# Patient Record
Sex: Female | Born: 1950 | Race: White | Hispanic: No | Smoking: Never smoker
Health system: Southern US, Community
[De-identification: ages and names within clinical notes are randomized; demographics above are authoritative.]

## PROBLEM LIST (undated history)

## (undated) DIAGNOSIS — M069 Rheumatoid arthritis, unspecified: Secondary | ICD-10-CM

## (undated) DIAGNOSIS — F329 Major depressive disorder, single episode, unspecified: Secondary | ICD-10-CM

## (undated) DIAGNOSIS — F419 Anxiety disorder, unspecified: Secondary | ICD-10-CM

## (undated) DIAGNOSIS — M797 Fibromyalgia: Secondary | ICD-10-CM

## (undated) DIAGNOSIS — F32A Depression, unspecified: Secondary | ICD-10-CM

## (undated) DIAGNOSIS — M359 Systemic involvement of connective tissue, unspecified: Secondary | ICD-10-CM

## (undated) DIAGNOSIS — J984 Other disorders of lung: Secondary | ICD-10-CM

## (undated) HISTORY — DX: Other disorders of lung: J98.4

## (undated) HISTORY — PX: ABDOMINAL HYSTERECTOMY: SHX81

## (undated) HISTORY — DX: Rheumatoid arthritis, unspecified: M06.9

## (undated) HISTORY — DX: Fibromyalgia: M79.7

## (undated) HISTORY — DX: Anxiety disorder, unspecified: F41.9

## (undated) HISTORY — DX: Major depressive disorder, single episode, unspecified: F32.9

## (undated) HISTORY — DX: Depression, unspecified: F32.A

---

## 1998-08-06 ENCOUNTER — Other Ambulatory Visit: Admission: RE | Admit: 1998-08-06 | Discharge: 1998-08-06 | Payer: Self-pay | Admitting: Obstetrics and Gynecology

## 1999-12-14 ENCOUNTER — Other Ambulatory Visit: Admission: RE | Admit: 1999-12-14 | Discharge: 1999-12-14 | Payer: Self-pay | Admitting: Obstetrics and Gynecology

## 2000-03-12 ENCOUNTER — Inpatient Hospital Stay (HOSPITAL_COMMUNITY): Admission: EM | Admit: 2000-03-12 | Discharge: 2000-03-17 | Payer: Self-pay | Admitting: *Deleted

## 2002-09-29 ENCOUNTER — Other Ambulatory Visit: Admission: RE | Admit: 2002-09-29 | Discharge: 2002-09-29 | Payer: Self-pay | Admitting: Obstetrics and Gynecology

## 2003-10-08 ENCOUNTER — Other Ambulatory Visit: Admission: RE | Admit: 2003-10-08 | Discharge: 2003-10-08 | Payer: Self-pay | Admitting: Obstetrics and Gynecology

## 2005-02-27 ENCOUNTER — Other Ambulatory Visit: Admission: RE | Admit: 2005-02-27 | Discharge: 2005-02-27 | Payer: Self-pay | Admitting: Obstetrics and Gynecology

## 2006-02-28 ENCOUNTER — Emergency Department (HOSPITAL_COMMUNITY): Admission: EM | Admit: 2006-02-28 | Discharge: 2006-03-01 | Payer: Self-pay | Admitting: Emergency Medicine

## 2006-04-02 ENCOUNTER — Encounter: Admission: RE | Admit: 2006-04-02 | Discharge: 2006-04-02 | Payer: Self-pay | Admitting: Neurosurgery

## 2006-04-19 ENCOUNTER — Encounter: Admission: RE | Admit: 2006-04-19 | Discharge: 2006-04-19 | Payer: Self-pay | Admitting: Endocrinology

## 2006-04-19 ENCOUNTER — Other Ambulatory Visit: Admission: RE | Admit: 2006-04-19 | Discharge: 2006-04-19 | Payer: Self-pay | Admitting: Interventional Radiology

## 2006-04-19 ENCOUNTER — Encounter (INDEPENDENT_AMBULATORY_CARE_PROVIDER_SITE_OTHER): Payer: Self-pay | Admitting: *Deleted

## 2006-12-07 ENCOUNTER — Encounter: Admission: RE | Admit: 2006-12-07 | Discharge: 2006-12-07 | Payer: Self-pay | Admitting: Endocrinology

## 2008-03-15 IMAGING — US US SOFT TISSUE HEAD/NECK
1 series · 14 of 25 positions shown · non-contrast
Comparison: none

CLINICAL DATA: Follow up thyroid nodules.  
 THYROID ULTRASOUND:
TECHNIQUE: Ultrasound examination of the thyroid gland and adjacent soft tissue structures was performed.

[Series 1: us soft tissue head/neck · 0.06mm/px · 14 of 40 slices shown]
[im 1/40]
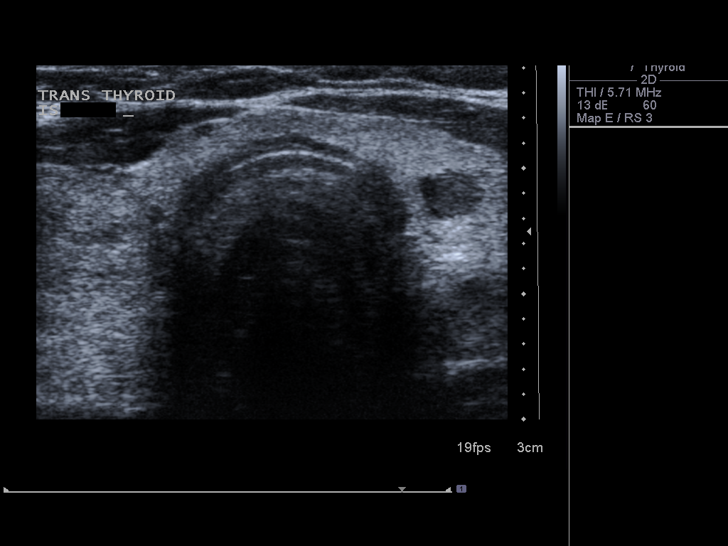
[im 4/40]
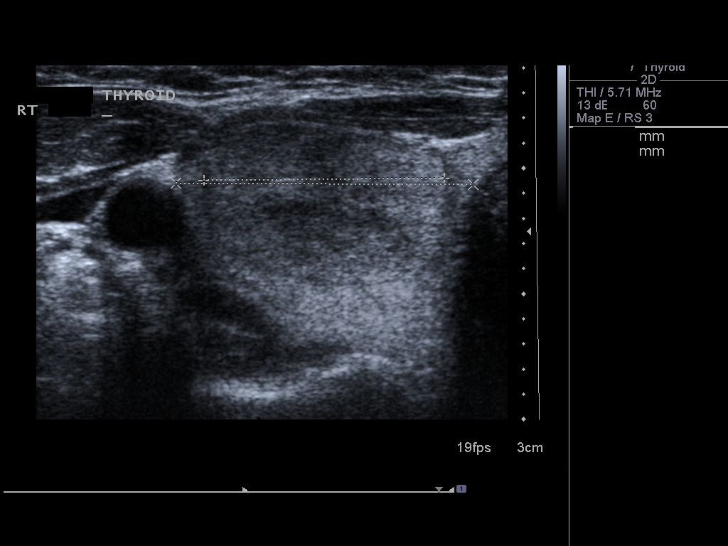
[im 7/40]
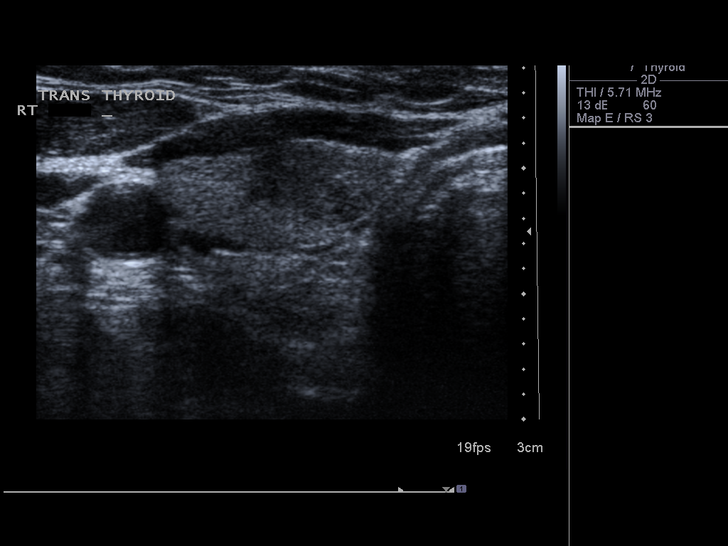
[im 10/40]
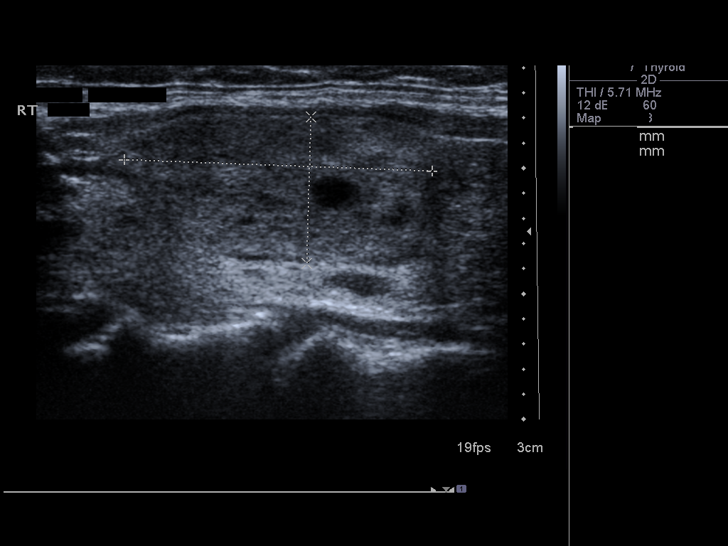
[im 14/40]
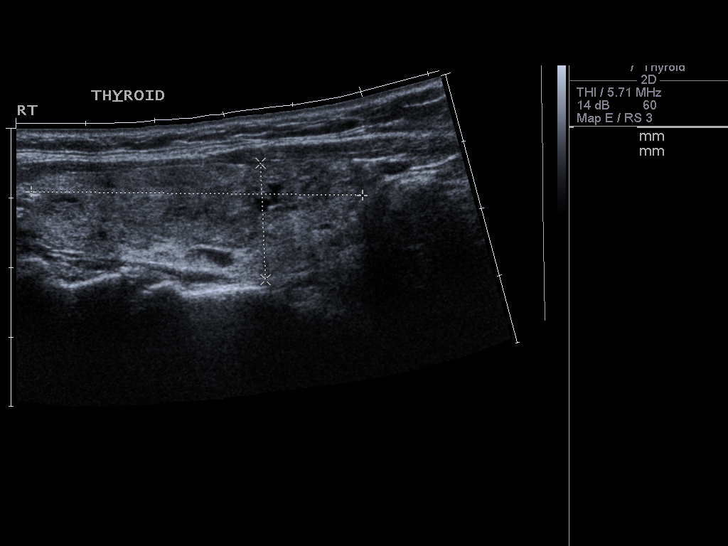
[im 15/40]
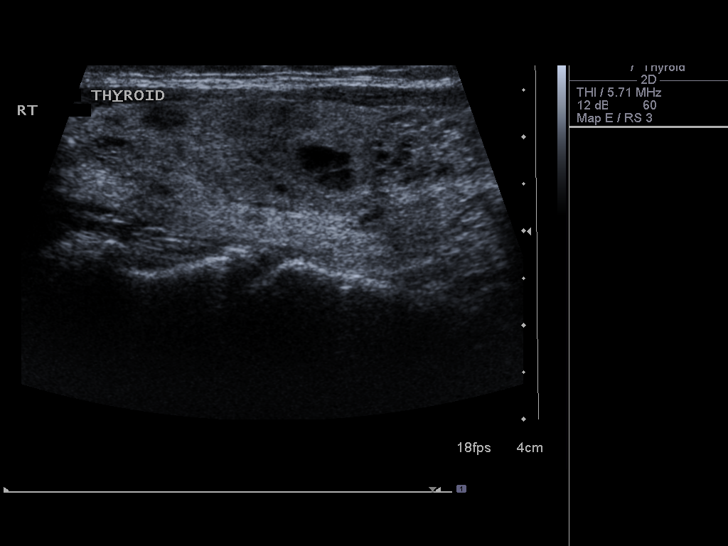
[im 18/40]
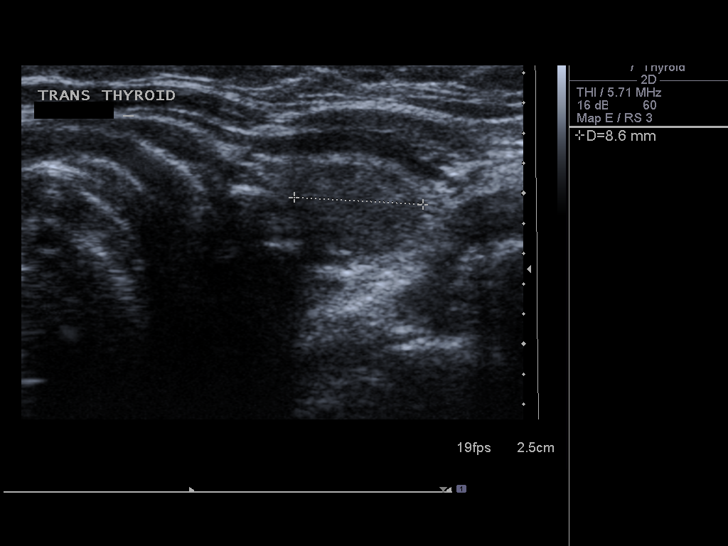
[im 22/40]
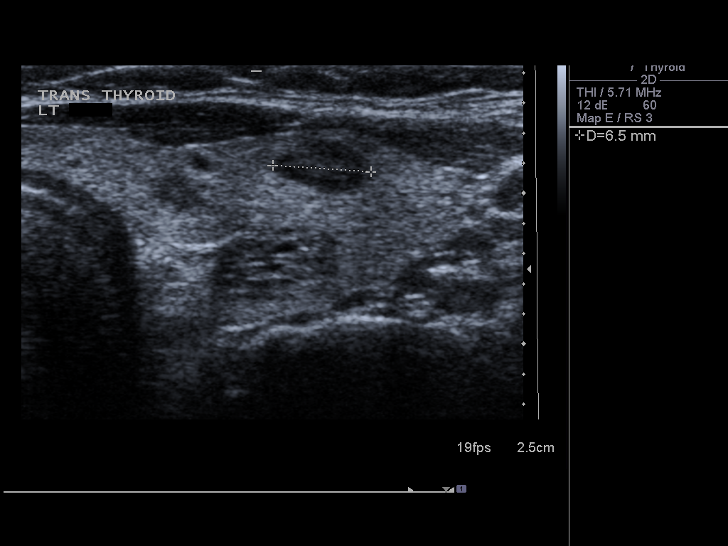
[im 25/40]
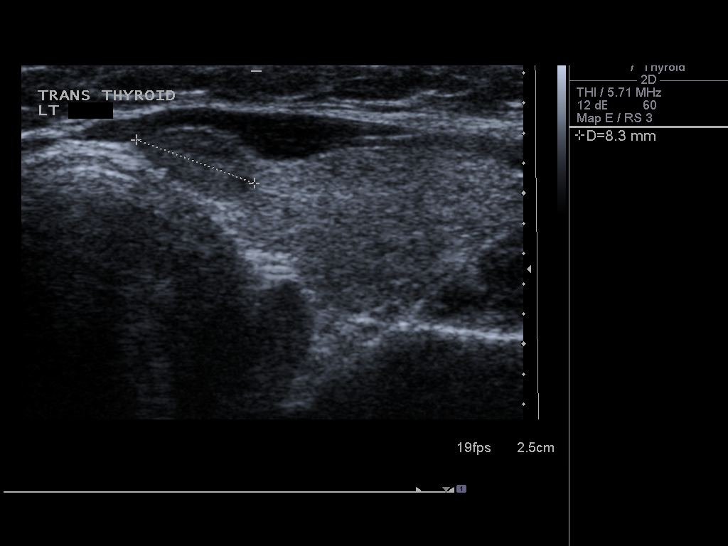
[im 27/40]
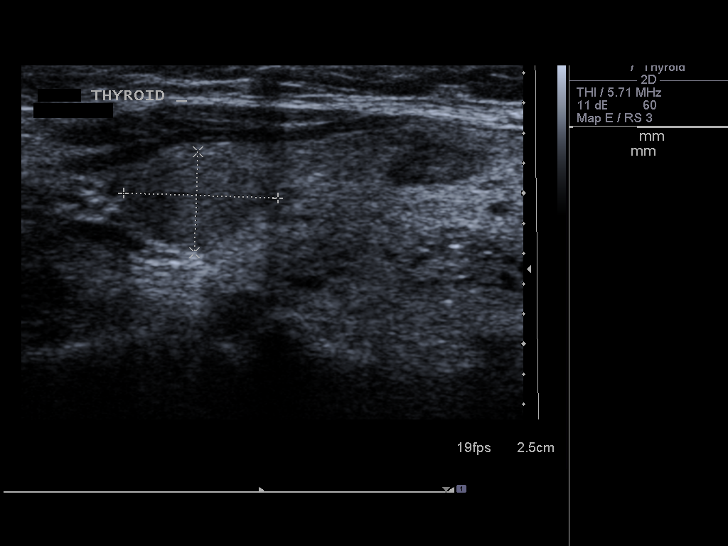
[im 30/40]
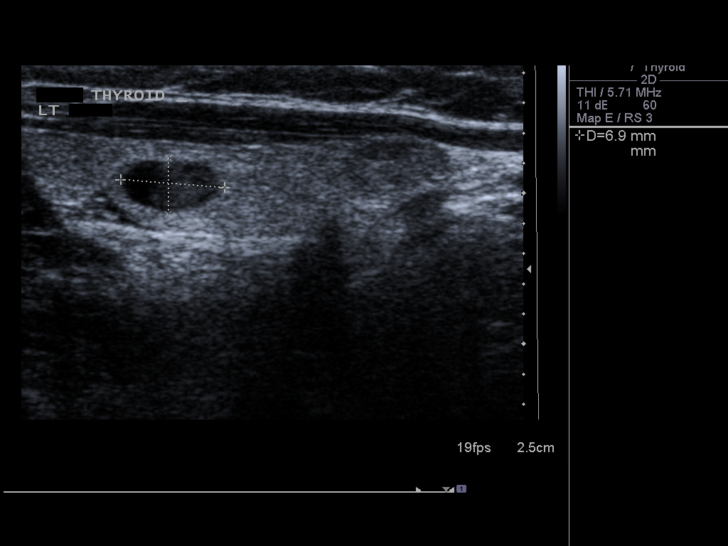
[im 33/40]
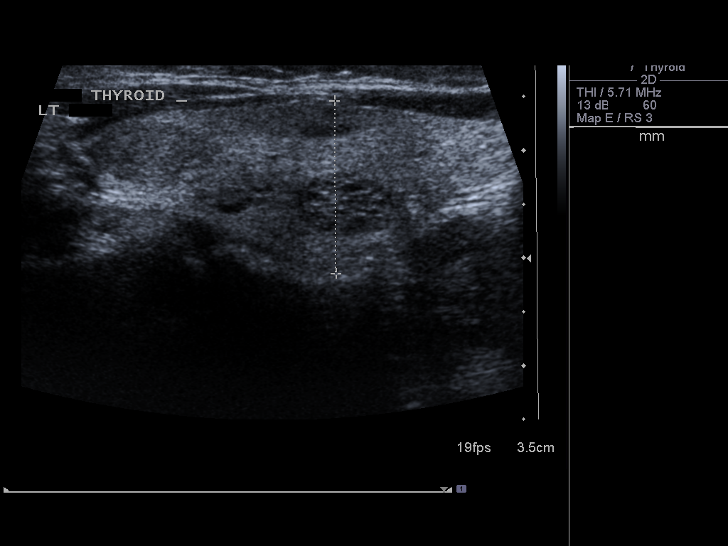
[im 36/40]
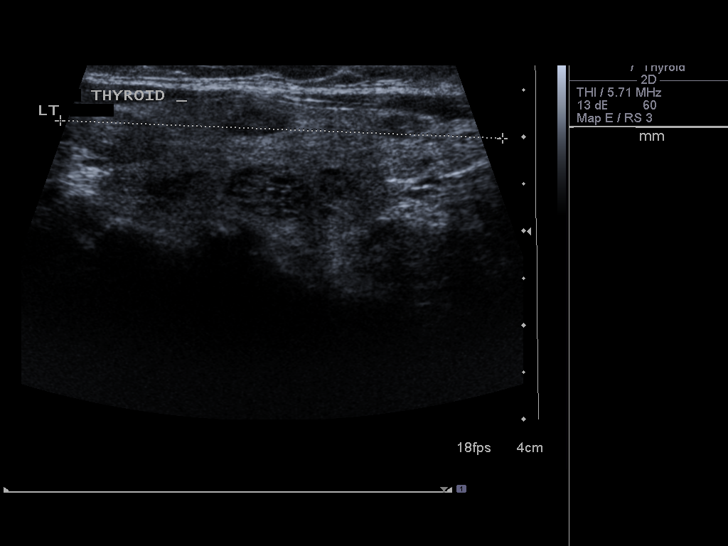
[im 40/40]
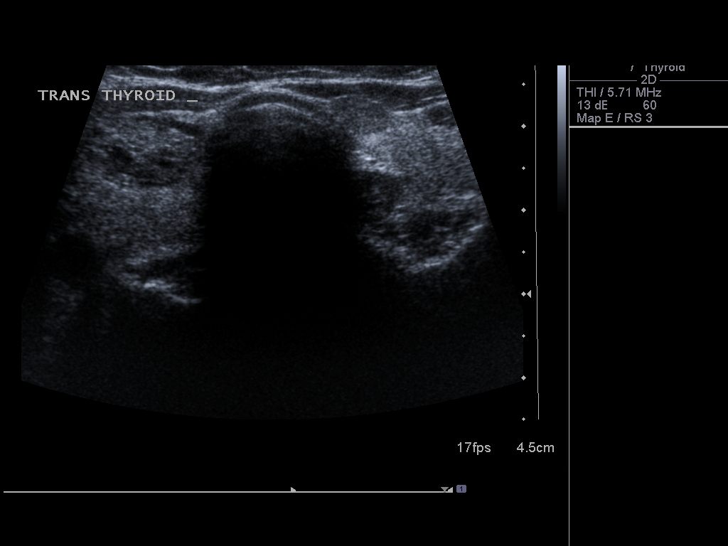

[14 of 25 positions shown; findings below may reference images not displayed]

FINDINGS: Currently thyroid gland is upper limits of normal in size with the right lobe measuring 4.8cm long x 1.8cm AP x 2.4cm wide (04/02/06 5.4 x 2.2 x 2.3cm).  Left lobe currently measures 4.7cm long x 1.6cm AP x 2.2cm wide (previous left lobe 4.7 x 1.5 x 2.1cm).  The isthmus measures 2mm.  Thyroid echotexture is diffusely inhomogeneous.  Again seen are multiple primarily sub-cm nodules.  The dominant mid right lobe solid nodule currently measures 2.5cm long x 1.2cm AP x 1.9cm wide and previously measured 2.3 x 1.8 x 1.5cm.  No new significant abnormality is seen.
IMPRESSION: 1.  Essentially stable 2.5cm solid dominant nodule mid right lobe. 
 2.  Thyroid gland upper limits of normal in size with multiple probable adenomatous changes consistent with slight multinodular goiter.

## 2009-04-06 ENCOUNTER — Inpatient Hospital Stay (HOSPITAL_COMMUNITY): Admission: EM | Admit: 2009-04-06 | Discharge: 2009-04-12 | Payer: Self-pay | Admitting: Emergency Medicine

## 2009-04-06 ENCOUNTER — Ambulatory Visit: Payer: Self-pay | Admitting: Family Medicine

## 2009-04-06 ENCOUNTER — Encounter: Payer: Self-pay | Admitting: Rheumatology

## 2009-04-06 ENCOUNTER — Ambulatory Visit: Payer: Self-pay | Admitting: Critical Care Medicine

## 2009-04-13 ENCOUNTER — Telehealth: Payer: Self-pay | Admitting: Family Medicine

## 2009-04-26 ENCOUNTER — Telehealth: Payer: Self-pay | Admitting: Family Medicine

## 2009-04-27 ENCOUNTER — Encounter: Payer: Self-pay | Admitting: *Deleted

## 2009-05-03 ENCOUNTER — Telehealth (INDEPENDENT_AMBULATORY_CARE_PROVIDER_SITE_OTHER): Payer: Self-pay | Admitting: *Deleted

## 2009-06-01 ENCOUNTER — Encounter: Payer: Self-pay | Admitting: Family Medicine

## 2009-06-01 DIAGNOSIS — E039 Hypothyroidism, unspecified: Secondary | ICD-10-CM | POA: Insufficient documentation

## 2009-06-01 DIAGNOSIS — G4700 Insomnia, unspecified: Secondary | ICD-10-CM

## 2009-06-01 DIAGNOSIS — F419 Anxiety disorder, unspecified: Secondary | ICD-10-CM

## 2009-06-01 DIAGNOSIS — G43909 Migraine, unspecified, not intractable, without status migrainosus: Secondary | ICD-10-CM | POA: Insufficient documentation

## 2009-06-01 DIAGNOSIS — IMO0001 Reserved for inherently not codable concepts without codable children: Secondary | ICD-10-CM | POA: Insufficient documentation

## 2009-06-01 DIAGNOSIS — R5382 Chronic fatigue, unspecified: Secondary | ICD-10-CM

## 2009-06-01 DIAGNOSIS — M069 Rheumatoid arthritis, unspecified: Secondary | ICD-10-CM

## 2009-06-01 DIAGNOSIS — F410 Panic disorder [episodic paroxysmal anxiety] without agoraphobia: Secondary | ICD-10-CM | POA: Insufficient documentation

## 2009-06-17 ENCOUNTER — Encounter: Payer: Self-pay | Admitting: Family Medicine

## 2009-06-17 ENCOUNTER — Ambulatory Visit: Payer: Self-pay | Admitting: Family Medicine

## 2009-06-17 DIAGNOSIS — J984 Other disorders of lung: Secondary | ICD-10-CM

## 2009-06-18 ENCOUNTER — Encounter: Payer: Self-pay | Admitting: Family Medicine

## 2009-06-18 ENCOUNTER — Telehealth: Payer: Self-pay | Admitting: Family Medicine

## 2009-08-12 ENCOUNTER — Encounter (HOSPITAL_COMMUNITY): Admission: RE | Admit: 2009-08-12 | Discharge: 2009-10-06 | Payer: Self-pay | Admitting: Internal Medicine

## 2009-08-17 ENCOUNTER — Telehealth: Payer: Self-pay | Admitting: Family Medicine

## 2009-08-20 ENCOUNTER — Telehealth: Payer: Self-pay | Admitting: Family Medicine

## 2009-09-21 ENCOUNTER — Ambulatory Visit: Payer: Self-pay | Admitting: Family Medicine

## 2009-09-21 ENCOUNTER — Encounter: Payer: Self-pay | Admitting: Family Medicine

## 2009-09-21 DIAGNOSIS — M669 Spontaneous rupture of unspecified tendon: Secondary | ICD-10-CM | POA: Insufficient documentation

## 2009-09-21 LAB — CONVERTED CEMR LAB
Albumin: 4.5 g/dL (ref 3.5–5.2)
BUN: 21 mg/dL (ref 6–23)
Chloride: 106 meq/L (ref 96–112)
Potassium: 4 meq/L (ref 3.5–5.3)
RBC: 3.7 M/uL — ABNORMAL LOW (ref 3.87–5.11)
RDW: 21.4 % — ABNORMAL HIGH (ref 11.5–15.5)
TSH: 0.332 microintl units/mL — ABNORMAL LOW (ref 0.350–4.500)
Total Bilirubin: 0.2 mg/dL — ABNORMAL LOW (ref 0.3–1.2)
Total Protein: 7 g/dL (ref 6.0–8.3)
WBC: 3 10*3/uL — ABNORMAL LOW (ref 4.0–10.5)

## 2009-09-24 ENCOUNTER — Telehealth: Payer: Self-pay | Admitting: *Deleted

## 2009-09-30 ENCOUNTER — Encounter: Payer: Self-pay | Admitting: Family Medicine

## 2009-10-29 ENCOUNTER — Telehealth: Payer: Self-pay | Admitting: Family Medicine

## 2009-11-02 ENCOUNTER — Telehealth: Payer: Self-pay | Admitting: Family Medicine

## 2009-11-03 ENCOUNTER — Ambulatory Visit: Payer: Self-pay | Admitting: Family Medicine

## 2009-11-03 DIAGNOSIS — M79609 Pain in unspecified limb: Secondary | ICD-10-CM

## 2009-11-05 ENCOUNTER — Telehealth: Payer: Self-pay | Admitting: Family Medicine

## 2009-11-12 ENCOUNTER — Ambulatory Visit: Payer: Self-pay | Admitting: Family Medicine

## 2009-11-15 ENCOUNTER — Encounter: Payer: Self-pay | Admitting: Family Medicine

## 2009-11-15 ENCOUNTER — Telehealth: Payer: Self-pay | Admitting: *Deleted

## 2009-11-19 ENCOUNTER — Encounter: Payer: Self-pay | Admitting: Family Medicine

## 2009-12-03 ENCOUNTER — Telehealth: Payer: Self-pay | Admitting: Family Medicine

## 2009-12-13 ENCOUNTER — Encounter: Payer: Self-pay | Admitting: Family Medicine

## 2009-12-14 ENCOUNTER — Encounter: Payer: Self-pay | Admitting: Family Medicine

## 2009-12-14 ENCOUNTER — Ambulatory Visit: Payer: Self-pay | Admitting: Family Medicine

## 2009-12-14 LAB — CONVERTED CEMR LAB
ALT: 9 units/L (ref 0–35)
Basophils Relative: 1 % (ref 0–1)
CO2: 23 meq/L (ref 19–32)
Chloride: 103 meq/L (ref 96–112)
Eosinophils Absolute: 0.2 10*3/uL (ref 0.0–0.7)
Free T4: 0.76 ng/dL — ABNORMAL LOW (ref 0.80–1.80)
HCT: 36.7 % (ref 36.0–46.0)
Hemoglobin: 12.6 g/dL (ref 12.0–15.0)
Lymphocytes Relative: 23 % (ref 12–46)
Lymphs Abs: 0.9 10*3/uL (ref 0.7–4.0)
MCV: 103.1 fL — ABNORMAL HIGH (ref 78.0–100.0)
Monocytes Absolute: 0.3 10*3/uL (ref 0.1–1.0)
Monocytes Relative: 9 % (ref 3–12)
Neutrophils Relative %: 62 % (ref 43–77)
Potassium: 4.3 meq/L (ref 3.5–5.3)
RBC: 3.56 M/uL — ABNORMAL LOW (ref 3.87–5.11)
Sodium: 139 meq/L (ref 135–145)
Total Bilirubin: 0.2 mg/dL — ABNORMAL LOW (ref 0.3–1.2)
Total Protein: 7.4 g/dL (ref 6.0–8.3)
WBC: 4 10*3/uL (ref 4.0–10.5)

## 2009-12-28 ENCOUNTER — Telehealth: Payer: Self-pay | Admitting: Family Medicine

## 2009-12-28 ENCOUNTER — Telehealth: Payer: Self-pay | Admitting: *Deleted

## 2010-02-17 ENCOUNTER — Ambulatory Visit: Admit: 2010-02-17 | Payer: Self-pay

## 2010-02-20 ENCOUNTER — Encounter: Payer: Self-pay | Admitting: Internal Medicine

## 2010-02-20 ENCOUNTER — Encounter: Payer: Self-pay | Admitting: Endocrinology

## 2010-02-23 ENCOUNTER — Ambulatory Visit: Admit: 2010-02-23 | Payer: Self-pay

## 2010-03-01 ENCOUNTER — Encounter: Payer: Self-pay | Admitting: Family Medicine

## 2010-03-03 NOTE — Progress Notes (Signed)
Summary: records request  Phone Note Call from Patient   Caller: Patient Call For: [phone] Summary of Call: Pt want latest lab report to also be sent to Dr. Sharl Ma with [facility].   Pt didn't have phone or fax number to leave. Initial call taken by: Abundio Miu,  November 05, 2009 3:33 PM    Do we need her to sign a release of information first? Alvia Grove  Appended Document: records request She already signed a ROI.  It is in the to be scanned pile

## 2010-03-03 NOTE — Miscellaneous (Signed)
Summary: needs appt  Clinical Lists Changes unable to reach by phone. never came to hosp f/u & we are getting refill requests. the only number I have has a full mailbox. I faxed the request back to the pharmacy asking if they had another number. pt will need an appt.Golden Circle RN  Jun 01, 2009 12:14 PM      Agree.  She has scheduled and cancelled 2 appts.  No med refills.  Alvia Grove

## 2010-03-03 NOTE — Progress Notes (Signed)
Summary: Rx Prob  Phone Note Call from Patient Call back at Home Phone [phone]   Caller: Patient Summary of Call: Pt says pharmacy does not have the rx that was sent in on 05/01/09.   Initial call taken by: Clydell Hakim,  May 03, 2009 2:11 PM  Follow-up for Phone Call        spoke with patient and she states pharmacy now has Rx. Follow-up by: Theresia Lo RN,  May 03, 2009 3:54 PM

## 2010-03-03 NOTE — Progress Notes (Signed)
Summary: phn msg  Phone Note From Other Clinic   Caller: [facility] Summary of Call: [facility] has discharged her from their practice - they will not be able to see her. Initial call taken by: De Nurse,  December 28, 2009 8:35 AM  Follow-up for Phone Call        Will attempt to refer to [facility] called and left message for Joann-new patient coordinator. Will also fax pt records there, FYI to MD Follow-up by: Garen Grams LPN,  December 30, 2009 3:50 PM

## 2010-03-03 NOTE — Progress Notes (Signed)
Summary: Rx Req  Phone Note Call from Patient Call back at Home Phone [phone]   Caller: Patient Summary of Call: Pt wondering if she can get refills on her meds.  She rescheduled her appt for August 9th at 1:30.   Initial call taken by: Clydell Hakim,  August 20, 2009 2:46 PM     No, she has to be seen first.

## 2010-03-03 NOTE — Progress Notes (Signed)
Summary: refill  Phone Note Refill Request Call back at Home Phone [phone] Message from:  Patient  Refills Requested: Medication #1:  PERCOCET 5-325 MG TABS 1 tab by mouth every 4 hours as needed for pain Initial call taken by: De Nurse,  December 03, 2009 2:07 PM    Prescriptions: PERCOCET 5-325 MG TABS (OXYCODONE-ACETAMINOPHEN) 1 tab by mouth every 4 hours as needed for pain  #60 x 0   Entered and Authorized by:   Alvia Grove DO   Signed by:   Alvia Grove DO on 12/03/2009   Method used:   Print then Give to Patient   RxID:   [id number] PERCOCET 5-325 MG TABS (OXYCODONE-ACETAMINOPHEN) 1 tab by mouth every 4 hours as needed for pain  #60 x 0   Entered and Authorized by:   Alvia Grove DO   Signed by:   Alvia Grove DO on 12/03/2009   Method used:   Print then Give to Patient   RxID:   [id number]  NOTE ONLY 1 SCRIPT GIVEN.  2nd printed in error.

## 2010-03-03 NOTE — Progress Notes (Signed)
Summary: Rx Prob  Phone Note Call from Patient Call back at Home Phone [phone]   Caller: Patient Summary of Call: Pt called and to say that the only medicine that insurance would not cover was azithromycin.  Can something else be called in in its place.  Her pharmacy is [facility] [place], [place]  [id number]. Initial call taken by: Clydell Hakim,  April 13, 2009 11:23 AM      Pt was not on antibiotics in hospital and I just reviewed all of her discharge meds and she was not given azithromycin.    Appended Document: Rx Prob called pharmacy and azythromycin was given to patient by Benn Moulder from [facility]. this was not a med given at discharge from hospital. she saw  doctor at [facility] Tuesday AM then went into hospital Tuesday evening. she took all RX she had to pharmacy. advised patient to contact [facility] about this.

## 2010-03-03 NOTE — Consult Note (Signed)
[facility]: [facility]   Imported By: Knox Royalty 12/31/2009 11:38:38  _____________________________________________________________________  External Attachment:    Type:   Image     Comment:   External Document

## 2010-03-03 NOTE — Assessment & Plan Note (Signed)
Summary: Carla Wilson   Vital Signs:  Patient profile:   60 year old female Height:      64 inches Weight:      164 pounds BMI:     28.25 BSA:     1.80 Temp:     98.6 degrees F Pulse rate:   99 / minute BP sitting:   140 / 100  Vitals Entered By: Jone Baseman CMA (Jun 17, 2009 1:37 PM) CC: NP - HFU Is Patient Diabetic? No Pain Assessment Patient in pain? yes     Location: back and feet Intensity: 8   Primary Care Provider:  Alvia Grove DO  CC:  NP - HFU.  History of Present Illness: Pt here for hospital f/u (suppose to be seen in 2022-04-19, but had death in family): 1. RA: Saw Dr. Corliss Skains, [facility], yesterday.  Pt reports Dr. Corliss Skains wanting pt to continue cymbalta and f/u with pulmonology for ? lung dz.  Pt to see [place] pulm tomorrow. Per pt report Dr. Corliss Skains considering leflunomide.  Labs drawn at [facility] yesterday.  Pt reports + arthritic pain in AM lasting > than 1 hour, swollen joints on bilateral hands, pain in most joints thurout body.  2.  Lung dz: Pt continues taking prednisone 20mg  once daily per pulm.  To see pulm tomorrow.  Reports weight gain with medicine.  No SOB, no dyspnea, no difficulty breathing or cathcing her breath.   3.  Endocrine: Thyroid nodule noted in hosptial.  Pt seen at [facility] endocr[facility], will get records from their office.  labs drawn at last visit. No meds started be endocrin.  Pt reports weight gain.  No hair loss, has chronic fatigue, no dry skin.    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 Take 1 Pill Daily 2)  Prednisone 20 Mg Tabs (Prednisone) .... Take 1 Pill Per Day As Dosed Per Your Pulmonolgoist 3)  Ultram 50 Mg Tabs (Tramadol Hcl) .Marland Kitchen.. 1 Pill By Mouth Q6 Hours As Needed Pain 4)  Ambien 10 Mg Tabs (Zolpidem Tartrate) .... Take 1 Pill At Bedtime Prn Insomnia 5)  Xanax 1 Mg Tabs (Alprazolam) .... Take 1 Pill By Mouth Two Times A Day Prn 6)  Vicodin 5-500 Mg Tabs (Hydrocodone-Acetaminophen) .... Take 1/2- 1 Pill Every 6-8 Hours As Needed Pain  Allergies (verified): 1)  ! Pcn 2)  ! Asa 3)  ! Demerol  Family History: RA - father cancer - mother and sibling heart attack - MGF stroke - PGM cerebral hemorrhage - PGF Leukemia- father  Social History: Father recently died of leukemia Married, lives with husband Never Smoked Alcohol use-no Drug use-no Smoking Status:  never Drug Use:  no  Review of Systems       The patient complains of weight gain.  The patient denies dyspnea on exertion, peripheral edema, headaches, abdominal pain, muscle weakness, transient blindness, and difficulty walking.    Physical Exam  General:  VS reviewed, BP elevated, alert, well-developed, well-nourished, and  well-hydrated.   Head:  normocephalic.   Eyes:  vision grossly intact.   Lungs:  normal respiratory effort, normal breath sounds, no crackles, and no wheezes.   Heart:  normal rate, regular rhythm, no murmur, no gallop, and no rub.   Abdomen:  soft, non-tender, normal bowel sounds, and no distention.   Msk:  decreased ROM, joint tenderness, joint swelling, enlarged MCP joints, and enlarged PIP joints.  No RA nodules noted.  Neurologic:  alert & oriented X3 and cranial nerves II-XII intact.   Skin:  no rashes.   Psych:  Oriented X3, good eye contact, not anxious appearing, and not depressed appearing.     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  Only saw Dr. Corliss Skains one time and that was yesterday.  Will get rocords from her office to see tx and labs drawn at visit yesterday.  Will refill cymbalta, ultram, ambien and vicodin.  Pain/med contract signed today.   See pt instructions Her updated medication list for this problem includes:    Prednisone 20 Mg Tabs (Prednisone) .Marland Kitchen... Take 1 pill per day as dosed per your pulmonolgoist  Orders: [facility]  Level 4 ([address])  Problem # 2:  PULMONARY DISEASE (ICD-518.89) Assessment: Unchanged F/u with pulm tomorrow.  Pt still on prednisone given to her by pulm.  Asked pt to have records of visit sent to me.  PE reassuring.  Pt denies SOB, dyspnea.  Await pulms reccomendations  Problem # 3:  HYPOTHYROIDISM (ICD-244.9) Assessment: Unchanged Pt denies any current s/s of hypothyroidsm.  Seen by endocrine recently.  Will request labs and records from that visit.    Complete Medication List: 1)  Cymbalta 60 Mg Cpep (Duloxetine hcl) .... Take 1 pill daily 2)  Prednisone 20 Mg Tabs (Prednisone) .... Take 1 pill per day as dosed per your pulmonolgoist 3)  Ultram 50 Mg Tabs (Tramadol hcl) .Marland Kitchen.. 1 pill by mouth q6 hours as needed pain 4)  Ambien 10 Mg Tabs (Zolpidem tartrate) .... Take 1 pill at bedtime prn insomnia 5)  Xanax 1 Mg Tabs (Alprazolam) .... Take 1 pill by mouth two times a day prn 6)  Vicodin 5-500 Mg Tabs (Hydrocodone-acetaminophen) .... Take 1/2- 1 pill every 6-8 hours as needed pain  Patient Instructions: 1)  Please sign a form for records from Dr. Corliss Skains and Dr. Stann Mainland, and Dr. Sharl Ma, endocrinologist [facility] ) and Dr. Vincente Poli (OB/Gyn).   2)  Schedule an appointment with me 5/31 at 330 (ok to double book) 3)  Start checking your blood pressures daily and call me in 1 week with the numbers. Prescriptions: VICODIN 5-500 MG TABS (HYDROCODONE-ACETAMINOPHEN) take 1/2- 1 pill every 6-8 hours as needed  pain  #60 x 0   Entered and Authorized by:   Alvia Grove DO   Signed by:   Alvia Grove DO on 06/21/2009   Method used:   Print then Give to Patient   RxID:   [id number] XANAX 1 MG TABS (ALPRAZOLAM) take 1 pill by mouth two times a day PRN  #60 x 3   Entered and Authorized by:   Alvia Grove DO   Signed by:   Alvia Grove DO on 06/21/2009   Method used:   Print then Give to Patient   RxID:   [id number] AMBIEN 10 MG TABS (ZOLPIDEM TARTRATE) take 1 pill at bedtime PRN insomnia  #30 x 5   Entered and Authorized  by:   Alvia Grove DO   Signed by:   Alvia Grove DO on 06/21/2009   Method used:   Print then Give to Patient   RxID:   [id number] ULTRAM 50 MG TABS (TRAMADOL HCL) 1 pill by mouth Q6 hours as needed pain  #90 x 0   Entered and Authorized by:   Alvia Grove DO   Signed by:   Alvia Grove DO on 06/21/2009   Method used:   Print then Give to Patient   RxID:   [id number] PREDNISONE 20 MG TABS (PREDNISONE) Take 1 pill per day as dosed per your Pulmonolgoist  #30 x 0   Entered and Authorized by:   Alvia Grove DO   Signed by:   Alvia Grove DO on 06/21/2009   Method used:   Print then Give to Patient   RxID:   [id number] CYMBALTA 60 MG CPEP (DULOXETINE HCL) Take 1 pill daily  #30 x 5   Entered and Authorized by:   Alvia Grove DO   Signed by:   Alvia Grove DO on 06/21/2009   Method used:   Print then Give to Patient   RxID:   [id number]

## 2010-03-03 NOTE — Progress Notes (Signed)
Summary: Rx Req  Phone Note Call from Patient Call back at [facility] Phone [phone]   Caller: Patient Summary of Call: Pt requesting rx for Vicoden and Ambien. Initial call taken by: Clydell Hakim,  August 17, 2009 1:48 PM    She has refills on the Dasher.  As for the vicodin, she has an appt scheduled tomorrow with me and I will discuss refills of vicodin with her then.

## 2010-03-03 NOTE — Consult Note (Signed)
[facility]: [facility]   Imported By: De Nurse 11/25/2009 14:07:19  _____________________________________________________________________  External Attachment:    Type:   Image     Comment:   External Document

## 2010-03-03 NOTE — Progress Notes (Signed)
Summary: phn msg  Phone Note Call from Patient Call back at [facility] Phone [phone]   Summary of Call: pt called say that the pharm will be calling in meds for her since she couldn't come to last appt. b/c her brother died.  next appt 06-10-2022 Initial call taken by: De Nurse,  April 26, 2009 11:09 AM     Pt should have followed up with rheum last week.  Unsure what plan is as I have not recieved records from them.  Please forward med requests to me and I will fill them based on medical neccesity; I have not seen pt since d/c on March 16th.

## 2010-03-03 NOTE — Assessment & Plan Note (Signed)
Summary: f/up,tcb   Vital Signs:  Patient profile:   60 year old female Height:      64 inches Weight:      176.25 pounds BMI:     30.36 BSA:     1.86 Temp:     98.6 degrees F Pulse rate:   101 / minute BP sitting:   137 / 90  Vitals Entered By: Jone Baseman CMA (November 12, 2009 2:29 PM) CC: right leg pain no better Is Patient Diabetic? No Pain Assessment Patient in pain? yes     Location: right leg Intensity: 10   Primary Care Rhyan Radler:  Alvia Grove DO  CC:  right leg pain no better.  History of Present Illness: 60 yo female here for f/u of right leg pain, RA, and thyroid problems: 1. Right leg pain: present for about 2 weeks. Saw Dr. Lelon Perla on the 5th of October for the same and was given Percocet and Neurontin for pain control.  Pt reports improvement of pain with those 2 meds, but pain is still present.  Unknown trigger.  No trauma, no known injury.  Pain starts in her right knee and moves up her leg and into her lower right back; also has pain to light touch in her right thigh. Describes pain as electric shock with some numbness.   No muscle weakness, no gait disturbances. denies loss of bowel / bladder function, skin redness, muscle spasm or cramps. 2. RA: Last saw Dr. Corliss Skains in June 2011.  Continues to take Cymbalta, flexeril, and ultram per Dr. Corliss Skains.  No acute swelling at joints, no joint tenderness or redness.  Does have morning stiffness that lasts longer than 1 hour.  Is not on a daily DMARD.  RA is manged by rheum. 3. Thyroid nodule: Recently seen by endocrine.  Pt is not sure of plan or when next appt with Dr. Sharl Ma is.  Denies heat/cold intolerance.  +weight gain, but was also on prednisone for multiple months.      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 Take 1  Pill By Mouth Daily As Needed For Pain 2)  Percocet 5-325 Mg Tabs (Oxycodone-Acetaminophen) .Marland Kitchen.. 1 Tab By Mouth Every 4 Hours As Needed For Pain 3)  Neurontin 100 Mg Caps (Gabapentin) .Marland Kitchen.. 1 Tab By Mouth Twice A Day For 3 Days Then Three Times Per Day 4)  Cymbalta 20 Mg Cpep (Duloxetine Hcl) .Marland Kitchen.. 1 Tab By Mouth Daily 5)  Lunesta 3 Mg Tabs (Eszopiclone) .... Take 1 Pill By Mouth Qhs 6)  Protonix 40 Mg Tbec (Pantoprazole Sodium) .Marland Kitchen.. 1 Pill By Mouth Two Times A Day 7)  Ultram 50 Mg Tabs (Tramadol Hcl) .Marland Kitchen.. 1 Pill By Mouth Q6 Hours As Needed Pain 8)  Flexeril 10 Mg Tabs (Cyclobenzaprine Hcl) .Marland Kitchen.. 1 Pill By Mouth At Bedtime 9)  Alprazolam 1 Mg Tabs (Alprazolam) .Marland Kitchen.. 1 Pill By Mouth Two Times A Day  Allergies (verified): 1)  ! Pcn 2)  ! Asa 3)  ! Demerol  Past History:  Past Medical History: Last updated: 06/01/2009 CHRONIC FATIGUE SYNDROME (ICD-780.71) FIBROMYALGIA (ICD-729.1) PANIC ATTACK (ICD-300.01) HYPOTHYROIDISM (ICD-244.9) MIGRAINE HEADACHE (ICD-346.90) ARTHRITIS, RHEUMATOID (ICD-714.0) ASTHMA (ICD-493.90) ANXIETY (ICD-300.00) * DEPRESSION INSOMNIA (ICD-780.52)  Past Surgical History: Last updated: 06/01/2009 hysterectomy C section  Family History: Last updated: 2009/06/26 RA - father cancer - mother and sibling heart attack - MGF stroke - PGM cerebral hemorrhage - PGF Leukemia- father  Social History: Last updated: 26-Jun-2009 Father recently died of leukemia Married, lives with husband Never Smoked Alcohol use-no Drug use-no  Risk Factors: Alcohol Use: 0  (11/12/2009)  Risk Factors: Smoking Status: never (11/12/2009)  Family History: Reviewed history from 2009-06-26 and no changes required. RA - father cancer - mother and sibling heart attack - MGF stroke - PGM cerebral hemorrhage - PGF Leukemia- father  Social History: Reviewed history from 06-26-09 and no changes required. Father recently died of leukemia Married, lives with husband Never Smoked Alcohol use-no Drug use-no  Review of Systems       see HPI for full ROS  Physical Exam  General:  VS reviewed, alert, well-developed, well-nourished, and well-hydrated.   Neck:  no thyroid nodules or tenderness and no JVD.   Lungs:  normal respiratory effort, normal breath sounds, no crackles, and no wheezes.   Heart:  normal rate.   Abdomen:  soft, non-tender, normal bowel sounds, and no distention.   Msk:  Right knee:  No swelling, redness or warmth.  No gross deformity.  ROM limited by pain.  Good stability.  No crepitus.   Right thigh:  Tender to palpation.  No swelling, redness, or warmth.  5/5 stregnth, sensation  Right hip:  Full ROM.  Non tender to palpation  No enlarged MCP joints, No enlarged PIP joints, and NO enlarged DIP joints.  enlarged MCP joints, enlarged PIP joints, and enlarged DIP joints.   Skin:  no rashes.   Psych:  Oriented X3, good eye contact, not anxious appearing, and not depressed appearing.     Knee Exam  Anterior drawer:    Right negative Posterior drawer:    Right negative Lachman :    Right negative MCL:    Right negative LCL:    Right negative   Impression & Recommendations:  Problem # 1:  LEG PAIN, RIGHT (ICD-729.5) Refill percocet today.  Refer to ortho, Dr. Penni Bombard for further eval and reccomendations.  Exam consistent with neuropathic pain, unlikely cause of pain is a  herniated disc.  ? myalgia paresthetica.   Pain is not improving since last visit, may need physical therapy if worsens.  Orders: [facility]- Est  Level 4  (04540) Orthopedic Referral (Ortho)  Problem # 2:  FIBROMYALGIA (ICD-729.1) Leg pain may be releated to fibromyalgia?  Rheum follows pt for this. Advise f/u with Dr. Corliss Skains Her updated medication list for this problem includes:    Soma 350 Mg Tabs (Carisoprodol) .Marland Kitchen... Take 1  pill by mouth daily as needed for pain    Percocet 5-325 Mg Tabs (Oxycodone-acetaminophen) .Marland Kitchen... 1 tab by mouth every 4 hours as needed for pain    Ultram 50 Mg Tabs (Tramadol hcl) .Marland Kitchen... 1 pill by mouth q6 hours as needed pain    Flexeril 10 Mg Tabs (Cyclobenzaprine hcl) .Marland Kitchen... 1 pill by mouth at bedtime  Orders: [facility]- Est  Level 4 (98119)  Problem # 3:  ARTHRITIS, RHEUMATOID (ICD-714.0) Assessment: Unchanged Followed by rhuem. Pt  to see Dr. Corliss Skains Orders: [facility]- Est  Level 4 ([address]109)  Problem # 4:  HYPOTHYROIDISM (ICD-244.9) Pt to sign ROI for information release to Dr. Sharl Ma and so that I may have her endocrine records from him as well.   Complete Medication List: 1)  Soma 350 Mg Tabs (Carisoprodol) .... Take 1  pill by mouth daily as needed for pain 2)  Percocet 5-325 Mg Tabs (Oxycodone-acetaminophen) .Marland Kitchen.. 1 tab by mouth every 4 hours as needed for pain 3)  Neurontin 100 Mg Caps (Gabapentin) .Marland Kitchen.. 1 tab by mouth twice a day for 3 days then three times per day 4)  Cymbalta 20 Mg Cpep (Duloxetine hcl) .Marland Kitchen.. 1 tab by mouth daily 5)  Lunesta 3 Mg Tabs (Eszopiclone) .... Take 1 pill by mouth qhs 6)  Protonix 40 Mg Tbec (Pantoprazole sodium) .Marland Kitchen.. 1 pill by mouth two times a day 7)  Ultram 50 Mg Tabs (Tramadol hcl) .Marland Kitchen.. 1 pill by mouth q6 hours as needed pain 8)  Flexeril 10 Mg Tabs (Cyclobenzaprine hcl) .Marland Kitchen.. 1 pill by mouth at bedtime 9)  Alprazolam 1 Mg Tabs (Alprazolam) .Marland Kitchen.. 1 pill by mouth two times a day  Patient Instructions: 1)  Nice to see you today 2)  I am going to set you up to see Dr. Penni Bombard at [facility] about your leg 3)  I have refilled your medicines for you today. 4)  Take your Percocet  only when your pain is severe. 5)  Please sign ROI for Dr. Sharl Ma. 6)  Please schedule a follow-up appointment in 1 month.  Prescriptions: PERCOCET 5-325 MG TABS (OXYCODONE-ACETAMINOPHEN) 1 tab by mouth every 4 hours as needed for pain  #30 x 0   Entered and Authorized by:   Alvia Grove DO   Signed by:   Alvia Grove DO on 11/16/2009   Method used:   Handwritten   RxID:   [id number] NEURONTIN 100 MG CAPS (GABAPENTIN) 1 tab by mouth twice a day for 3 days then three times per day  #60 x 1   Entered and Authorized by:   Alvia Grove DO   Signed by:   Alvia Grove DO on 11/12/2009   Method used:   Electronically to        [facility]  [address] [phone]* (retail)       [address]       [place], [place]  [address]       Ph: [phone] or [phone]       Fax: [phone]   RxID:   [id number] SOMA 350 MG TABS (CARISOPRODOL) take 1  pill by mouth daily as needed for pain  #60 x 1   Entered and Authorized by:   Alvia Grove DO   Signed by:   Alvia Grove DO on 11/12/2009   Method used:   Electronically to        [facility]  [address] [phone]* (retail)       [address]       [place], [place]  [address]       Ph: [phone] or [phone]       Fax: [phone]   RxID:   [id number] FLEXERIL 10 MG TABS (CYCLOBENZAPRINE HCL) 1 pill by mouth at bedtime  #30 x 5   Entered and Authorized by:   Alvia Grove DO   Signed by:   Alvia Grove DO on  11/12/2009   Method used:   Electronically to        [facility]  [address] [phone]* (retail)       [address]       [place], [place]  [address]       Ph: [phone] or [phone]       Fax: [phone]   RxID:   [id number] ULTRAM 50 MG TABS (TRAMADOL HCL) 1 pill by mouth Q6 hours as needed pain  #90 x 5   Entered and Authorized by:   Alvia Grove DO   Signed by:   Alvia Grove DO on 11/12/2009   Method used:   [facility]ronically [facility]        [facility]  [address] [phone]* (retail)        [address]       [place], [place]  [address]       Ph: [phone] or [phone]       Fax: [phone]   RxID:   [id number] PROTONIX 40 MG TBEC (PANTOPRAZOLE SODIUM) 1 pill by mouth two times a day  #60 x 5   Entered and Authorized by:   Alvia Grove DO   Signed by:   Alvia Grove DO on 11/12/2009   Method used:   [facility]ronically [facility]        [facility]  [address] [phone]* (retail)       [address]       [place], [place]  [address]       Ph: [phone] or [phone]       Fax: [phone]   RxID:   [id number]

## 2010-03-03 NOTE — Miscellaneous (Signed)
Summary: ROI  ROI   Imported By: De Nurse 06/22/2009 16:04:42  _____________________________________________________________________  External Attachment:    Type:   Image     Comment:   External Document

## 2010-03-03 NOTE — Assessment & Plan Note (Signed)
Summary: meds/tlb   Vital Signs:  Patient profile:   60 year old female Height:      64 inches Weight:      174.4 pounds BMI:     30.04 Temp:     98.1 degrees F oral Pulse rate:   95 / minute BP sitting:   123 / 82  (left arm) Cuff size:   regular  Vitals Entered By: Garen Grams LPN (September 21, 2009 1:38 PM) CC: refill meds Is Patient Diabetic? No Pain Assessment Patient in pain? yes     Location: hands/knees/feet    Primary Care Keyler Hoge:  Alvia Grove DO  CC:  refill meds.  History of Present Illness: 60  yo female here for f/u RA, anxiety, fibromyalgia, thyroid nodule 1. RA: Pt followed by Dr Corliss Skains.  Last seen about 2  months ago.  Pt continues on prednisone per Deveshwar.  Weaned down to 10mg  daily.  C/o of fatigue and weight gain while on prednisone. Still  having joint pain that worsenes thruout the day.  Was also dx with fibromyalgia recently.  Continues to use her vicoden for breakthru pain.  Takes 1-2  daily and feels this improves her pain.  Lots of stress recently.  Mother recently dx with breast cancer and not doing well.  Father died about 4  months ago.   2. Anxiety: as above.  Has had stressful events the past few months.  Has been taking her xanax with good relief of acute symproms. 3. thyroid nodule: pt seen by endocrine docotr at [facility].  Per pt report, released from care as long as TSH is followed by PCP.  Does hVe fatigue and weight gain, but feels it may be due to prednisone.   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 Take 1 Pill By Mouth Daily 2)  Zolpidem Tartrate 10 Mg Tabs (Zolpidem Tartrate) .... Take 1 Pill Po At Bedtime 3)  Pantoprazole Sodium 40 Mg Tbec (Pantoprazole Sodium) .... Take 1 Pill Po Two Times A Day 4)  Ultram 50 Mg Tabs (Tramadol Hcl) .... Take 1 Pill Po Q6 Hours As Needed Pain 5)  Flexeril 10 Mg Tabs (Cyclobenzaprine Hcl) .... Take 1 Pill Po Two Times A Day 6)  Vicodin 5-500 Mg Tabs (Hydrocodone-Acetaminophen) .... Take 1/2 - 1 Pill By Mouth Q8 Hours As Needed Pain 7)  Xanax 1 Mg Tabs (Alprazolam) .... Take 1 Pill Po Bid 8)  Prednisone 10 Mg Tabs (Prednisone) .Marland Kitchen.. 1  Daily Per Dr Eustace Quail  Allergies (verified): 1)  ! Pcn 2)  ! Asa 3)  ! Demerol  Past History:  Past Medical History: Last updated: 06/01/2009 CHRONIC FATIGUE SYNDROME (ICD-780.71) FIBROMYALGIA (ICD-729.1) PANIC ATTACK (ICD-300.01) HYPOTHYROIDISM (ICD-244.9) MIGRAINE HEADACHE (ICD-346.90) ARTHRITIS, RHEUMATOID (ICD-714.0) ASTHMA (ICD-493.90) ANXIETY (ICD-300.00) * DEPRESSION INSOMNIA (ICD-780.52)  Past Surgical History: Last updated: 06/01/2009 hysterectomy C section  Family History: Last updated: 06/17/2009 RA - father cancer - mother and sibling heart attack - MGF stroke -  PGM cerebral hemorrhage - PGF Leukemia- father  Social History: Last updated: 07-02-09 Father recently died of leukemia Married, lives with husband Never Smoked Alcohol use-no Drug use-no  Risk Factors: Alcohol Use: 0 (09/21/2009)  Risk Factors: Smoking Status: never (09/21/2009)  Review of Systems       The patient complains of weight gain.  The patient denies difficulty walking and depression.    Physical Exam  General:  vs reviewed, alert, well-developed, well-nourished, and well-hydrated.   Neck:  no thyroid nodules or tenderness and no JVD.   Lungs:  normal respiratory effort, normal breath sounds, no crackles,  and no wheezes.   Heart:  normal rate, regular rhythm, no murmur, no gallop, and no rub.   Abdomen:  soft, non-tender, normal bowel sounds, and no distention.   Msk:  decreased ROM, joint tenderness, joint swelling, enlarged MCP joints, and enlarged PIP joints.  No RA nodules noted.  Skin:  no rashes.     Impression & Recommendations:  Problem # 1:  ARTHRITIS, RHEUMATOID (ICD-714.0) will request records from dr deveshwar.  has been on chronic prednisone since hospital d/c in March.  Did follow up with pulm for ? rheum lung, but pt states steroids were not addressed by her pulmonologist.  Would like to wean prednisone, but need rheum records before doing that.  For now will continue with current predbisone dose. Her updated medication list for this problem includes:    Prednisone 10 Mg Tabs (Prednisone) .Marland Kitchen... 1  daily per dr devershwar  Orders: Comp Met-[facility] [phone]) CBC-[facility] [phone]) [facility]- Est  Level 4 (18841)  Problem # 2:  HYPOTHYROIDISM (ICD-244.9) check tsh today Orders: TSH-[facility] ([id number])  Problem # 3:  FIBROMYALGIA (ICD-729.1) continue vicodin and tramadol.  pt under pain contract.  has not asked for any early refills and pill counts have been appropriate.  Her updated medication list for this problem includes:    Ultram 50 Mg Tabs (Tramadol hcl) .Marland Kitchen... Take 1 pill po q6 hours as needed pain    Flexeril 10 Mg Tabs (Cyclobenzaprine hcl) .Marland Kitchen... Take 1 pill po two times a day    Vicodin 5-500 Mg Tabs (Hydrocodone-acetaminophen) .Marland Kitchen... Take 1/2 - 1 pill by mouth q8 hours as needed pain  Problem # 4:  ANXIETY (ICD-300.00) continue low dose xanax short term only.  pt cites multiple acute stressors currently.  is having a hard time accepting mom's dx and prognosis.  interested in counseling, but does not feel she has time to committ to weekly therapy right now.   Her updated medication list for this problem includes:    Cymbalta 60 Mg Cpep (Duloxetine hcl) .Marland Kitchen... Take 1 pill by mouth  daily    Xanax 1 Mg Tabs (Alprazolam) .Marland Kitchen... Take 1 pill po bid  Orders: [facility]- Est  Level 4 (66063)  Problem # 5:  PULMONARY DISEASE (ICD-518.89) seen by dr wert.  will request records.   Problem # 6:  NONTRAUMATIC RUPTURE OF UNSPECIFIED TENDON (ICD-727.60) Assessment: New pt reports this was dx by dr Mindi Slicker pt was referred to hand surgery.  recc PT, but pt could not afford $40  co pay every session.  [facility] and tryand referr pt to other ortho office if needed.   Complete Medication List: 1)  Cymbalta 60 Mg Cpep (Duloxetine hcl) .... Take 1 pill by mouth daily 2)  Zolpidem Tartrate 10 Mg Tabs (Zolpidem tartrate) .... Take 1 pill po at bedtime 3)  Pantoprazole Sodium 40 Mg Tbec (Pantoprazole  sodium) .... Take 1 pill po two times a day 4)  Ultram 50 Mg Tabs (Tramadol hcl) .... Take 1 pill po q6 hours as needed pain 5)  Flexeril 10 Mg Tabs (Cyclobenzaprine hcl) .... Take 1 pill po two times a day 6)  Vicodin 5-500 Mg Tabs (Hydrocodone-acetaminophen) .... Take 1/2 - 1 pill by mouth q8 hours as needed pain 7)  Xanax 1 Mg Tabs (Alprazolam) .... Take 1 pill po bid 8)  Prednisone 10 Mg Tabs (Prednisone) .Marland Kitchen.. 1  daily per dr devershwar  Patient Instructions: 1)  Nice to see you today. 2)  I have refilled your current meds. 3)  For now, continue the prednisone until I am able to get your records from Dr. Corliss Skains. 4)  I will referr you to another surgeon once I get records. 5)  I will draw labs today and send them to your endocrinologist. Follow up in 2-3 weeks as your schedule allows.  Prescriptions: XANAX 1 MG TABS (ALPRAZOLAM) take 1 pill PO BID  #60 x 1   Entered and Authorized by:   Alvia Grove DO   Signed by:   Alvia Grove DO on 09/21/2009   Method used:   Handwritten   RxID:   [id number] VICODIN 5-500 MG TABS (HYDROCODONE-ACETAMINOPHEN) take 1/2 - 1 pill by mouth Q8 hours as needed pain  #90 x 1   Entered and Authorized by:   Alvia Grove DO    Signed by:   Alvia Grove DO on 09/21/2009   Method used:   Handwritten   RxID:   [id number] FLEXERIL 10 MG TABS (CYCLOBENZAPRINE HCL) take 1 pill Po two times a day  #60 x 5   Entered and Authorized by:   Alvia Grove DO   Signed by:   Alvia Grove DO on 09/21/2009   Method used:   [facility]  [address] [id number]       [address]       [place], [place]  [address]       Ph: [phone] or [phone]       Fax: [phone]   RxID:   [id number] ULTRAM 50 MG TABS (TRAMADOL HCL) take 1 pill Po Q6 hours as needed pain  #120 x 5   Entered and Authorized by:   Alvia Grove DO   Signed by:   Alvia Grove DO on 09/21/2009   Method used:   [facility]        [facility]  [address] [id number] ([facility]ail[facility]       [address]       [place], [place]  [address]       Ph: [phone] or [phone]       Fax: [phone]   RxID:   [id number] PANTOPRAZOLE SODIUM 40 MG TBEC (PANTOPRAZOLE SODIUM) take 1 pill Po two times a day  #60 x 1   Entered and Authorized by:   Alvia Grove DO   Signed by:   Alvia Grove DO on 09/21/2009   Method used:   [facility]        [facility]  [address] [id number] ([facility]ail[facility]       [address]       [place], [place]  [address]       Ph: [phone] or [phone]  Fax: [phone]   RxID:   [id number] ZOLPIDEM TARTRATE 10 MG TABS (ZOLPIDEM TARTRATE) take 1 pill Po at bedtime  #30 x 5   Entered and Authorized by:   Alvia Grove DO   Signed by:   Alvia Grove DO on 09/21/2009   Method used:   Handwritten   RxID:   [id number] CYMBALTA 60 MG CPEP (DULOXETINE HCL) take 1 pill by mouth daily  #30 x 5   Entered and Authorized by:   Alvia Grove DO   Signed by:   Alvia Grove DO on 09/21/2009   Method used:   [facility]        [facility]  [address] [id number]       [address]       [place],  [place]  [address]       Ph: [phone] or [phone]       Fax: [phone]   RxID:   [id number]

## 2010-03-03 NOTE — Progress Notes (Signed)
Summary: phn msg  Phone Note Call from Patient   Caller: Patient Summary of Call: Pt is to be referred to Dr. Melrose Nakayama and she wanted to leave numbers where she can be reached at [phone] or [phone] Watt Climes. Initial call taken by: Clydell Hakim,  November 15, 2009 1:47 PM  Follow-up for Phone Call        see order Follow-up by: Jone Baseman CMA,  November 16, 2009 11:36 AM

## 2010-03-03 NOTE — Assessment & Plan Note (Signed)
Summary: leg pain/[place]ham   Vital Signs:  Patient profile:   60 year old female Height:      64 inches Weight:      180.19 pounds BMI:     31.04 BSA:     1.87 Pulse rate:   84 / minute BP sitting:   100 / 68  Vitals Entered By: Jone Baseman CMA (November 03, 2009 2:36 PM) CC: right leg pain x 3 days Is Patient Diabetic? No Pain Assessment Patient in pain? yes     Location: right ;eg Intensity: 10   Primary Care Provider:  Alvia Grove DO  CC:  right leg pain x 3 days.  History of Present Illness: 1. Right leg pain - Been there for about 3 days - Thinks that it started after going to the beach and being active (did a lot of walking and getting up and down) - Pain started in her right knee and then moved up her leg and into her back - Now it really is just located in the right distal lateral and medial thigh - She does endorse some numbness in the same distribution - Has been taking Vicodin and Soma.  The soma seems to help some because it knocks her out - Pain rated a 10/10 - Pain described as a burning pain - No injury or inciting event  ROS: denies loss of bowel / bladder function, current back pain, loss of range of motion, skin redness, muscle spasm or cramps, weakness, problems with gait.  Habits & Providers  Alcohol-Tobacco-Diet     Alcohol drinks/day: 0     Tobacco Status: never  Current Medications (verified): 1)  Soma 350 Mg Tabs (Carisoprodol) .... Take 1  Pill By Mouth Daily As Needed For Pain 2)  Percocet 5-325 Mg Tabs (Oxycodone-Acetaminophen) .Marland Kitchen.. 1 Tab By Mouth Every 4 Hours As Needed For Pain 3)  Neurontin 100 Mg Caps (Gabapentin) .Marland Kitchen.. 1 Tab By Mouth Twice A Day For 3 Days Then Three Times Per Day 4)  Cymbalta 20 Mg Cpep (Duloxetine Hcl) .Marland Kitchen.. 1 Tab By Mouth Daily  Allergies: 1)  ! Pcn 2)  ! Asa 3)  ! Demerol  Past History:  Past Medical History: Reviewed history from 06/01/2009 and no changes required. CHRONIC FATIGUE SYNDROME  (ICD-780.71) FIBROMYALGIA (ICD-729.1) PANIC ATTACK (ICD-300.01) HYPOTHYROIDISM (ICD-244.9) MIGRAINE HEADACHE (ICD-346.90) ARTHRITIS, RHEUMATOID (ICD-714.0) ASTHMA (ICD-493.90) ANXIETY (ICD-300.00) * DEPRESSION INSOMNIA (ICD-780.52)  Physical Exam  General:  Vitals reviewed.  No acute distress.  Comfortable appearing Heart:  normal rate.   Msk:  Right knee:  No swelling, redness or warmth.  No gross deformity.  Full ROM.  No pain with movement or with palpation.  Good stability.  No crepitus.   Right thigh:  Non tender to palpation.  No swelling, redness, or warmth.  5/5 strength with extension and flexion.  Decreased sensation to the distal lateral and medial thigh to light touch.  Right hip:  Full ROM.  Non tender to palpation  Lower back:  Full extension and flexion.  Minimally TTP along right paraspinal muscles.  No spinal tenderness. Neurologic:  5/5 strength in lower extremities Normal gait Normal patellar reflexes bilaterally   Impression & Recommendations:  Problem # 1:  LEG PAIN, RIGHT (ICD-729.5) Assessment New  History and exam most consistent with neuropathic pain.  Doesn't seem consistent with a herniated disc.  ? myalgia paresthetica.  Will treat with Percocet for now and Neurontin.  Titrate up dose as needed.  Orders: [facility]- Est  Level 4 (99214)  Complete Medication List: 1)  Soma 350 Mg Tabs (Carisoprodol) .... Take 1  pill by mouth daily as needed for pain 2)  Percocet 5-325 Mg Tabs (Oxycodone-acetaminophen) .Marland Kitchen.. 1 tab by mouth every 4 hours as needed for pain 3)  Neurontin 100 Mg Caps (Gabapentin) .Marland Kitchen.. 1 tab by mouth twice a day for 3 days then three times per day 4)  Cymbalta 20 Mg Cpep (Duloxetine hcl) .Marland Kitchen.. 1 tab by mouth daily  Patient Instructions: 1)  I think that you are having some nerve pain 2)  I want you to stop taking the Vicodin and start taking the Percocet. 3)  Also cut back on the Soma to only once per day 4)  Start taking the Neurontin  twice a day and then in 3 days go up to three times per day 5)  Please schedule a follow up appointment in 1 week to see how you are doing 6)  If you develop any problems controlling your bowel / bladder or worsening pain please return to clinic or go to the ED Prescriptions: NEURONTIN 100 MG CAPS (GABAPENTIN) 1 tab by mouth twice a day for 3 days then three times per day  #60 x 0   Entered and Authorized by:   Angelena Sole MD   Signed by:   Angelena Sole MD on 11/03/2009   Method used:   Print then Give to Patient   RxID:   [id number] PERCOCET 5-325 MG TABS (OXYCODONE-ACETAMINOPHEN) 1 tab by mouth every 4 hours as needed for pain  #30 x 0   Entered and Authorized by:   Angelena Sole MD   Signed by:   Angelena Sole MD on 11/03/2009   Method used:   Print then Give to Patient   RxID:   [id number]   Prevention & Chronic Care Immunizations   Influenza vaccine: Not documented    Tetanus booster: Not documented    Pneumococcal vaccine: Not documented  Colorectal Screening   Hemoccult: Not documented    Colonoscopy: Not documented  Other Screening   Pap smear: Not documented    Mammogram: Not documented   Smoking status: never  (11/03/2009)  Lipids   Total Cholesterol: Not documented   LDL: Not documented   LDL Direct: Not documented   HDL: Not documented   Triglycerides: Not documented

## 2010-03-03 NOTE — Assessment & Plan Note (Signed)
Summary: f/u  kh   Vital Signs:  Patient profile:   60 year old female Height:      64 inches Weight:      177.1 pounds BMI:     30.51 Temp:     97.8 degrees F oral Pulse rate:   103 / minute BP sitting:   138 / 87  (left arm) Cuff size:   regular  Vitals Entered By: Garen Grams LPN (December 14, 2009 1:38 PM) CC: f/u pain Is Patient Diabetic? No Pain Assessment Patient in pain? yes     Location: back and rt leg   Primary Care Provider:  Alvia Grove DO  CC:  f/u pain.  History of Present Illness: 60 yo female here for f/u of pulm issues, right leg pain, RA, and thyroid problems: 1.  Pulm: breathing improved.  Prednisone d/c'd.  Sae pulmonologist awhile ago, unsure what the plan was.  2. Right leg pain: Saw Dr. Penni Bombard at [facility] about 1 month ago.  He started her on neurontin, which she feels worked well, but needs a refill today.  Dr. Penni Bombard did not think pt needed surgery.  Recc medical management. Consider Lumbar MRI if pain persists. Unknown trigger.  No trauma, no known injury.  Pain starts in her right knee and moves up her leg and into her lower right back; also has pain to light touch in her right thigh. Describes pain as electric shock with some numbness.   No muscle weakness, no gait disturbances. denies loss of bowel / bladder function, skin redness, muscle spasm or cramps. 3. RA: Last saw Dr. Corliss Skains yesterday.  Continues to take Cymbalta, flexeril, and ultram per Dr. Corliss Skains.  No acute swelling at joints, no joint tenderness or redness.  Does have morning stiffness that lasts longer than 1 hour.  Is not on a daily DMARD.  Pt requesting that I provide RA meds, as it is too difficult to get an appt with Deveshwar.  Needs labs today for Deveshwar, requesting to have them drawn here and then sent to [facility]. 4. Thyroid nodule: Recently seen by endocrine.  Pt is not sure of plan or when next appt with Dr. Sharl Ma is.  Denies heat/cold intolerance.  +weight gain, but  was also on prednisone for multiple months.        Habits & Providers  Alcohol-Tobacco-Diet     Alcohol drinks/day: 0     Tobacco Status: never  Current Problems (verified): 1)  Leg Pain, Right  (ICD-729.5) 2)  Nontraumatic Rupture of Unspecified Tendon  (ICD-727.60) 3)  Pulmonary Disease  (ICD-518.89) 4)  Chronic Fatigue Syndrome  (ICD-780.71) 5)  Fibromyalgia  (ICD-729.1) 6)  Panic Attack  (ICD-300.01) 7)  Hypothyroidism  (ICD-244.9) 8)  Migraine Headache  (ICD-346.90) 9)  Arthritis, Rheumatoid  (ICD-714.0) 10)  Anxiety  (ICD-300.00) 11)  Depression  () 12)  Insomnia  (ICD-780.52)  Current Medications (verified): 1)  Percocet 5-325 Mg Tabs (Oxycodone-Acetaminophen) .Marland Kitchen.. 1 Tab By Mouth Every 4 Hours As Needed For Pain 2)  Neurontin 300 Mg Caps (Gabapentin) .... Take 300mg  Po Once Daily, Then Increase To Two Times A Day, Then Three Times A Day and Continue 3)  Cymbalta 20 Mg Cpep (Duloxetine Hcl) .Marland Kitchen.. 1 Tab By Mouth Daily 4)  Alprazolam 1 Mg Tabs (Alprazolam) .Marland Kitchen.. 1 By Mouth Q12hrs As Needed Anxiety 5)  Lunesta 3 Mg Tabs (Eszopiclone) .Marland Kitchen.. 1 Pill By Mouth Qhs 6)  Ultram 50 Mg Tabs (Tramadol Hcl) 7)  Soma 350 Mg  Tabs (Carisoprodol)  Allergies (verified): 1)  ! Pcn 2)  ! Asa 3)  ! Demerol  Past History:  Past Medical History: Last updated: 06/01/2009 CHRONIC FATIGUE SYNDROME (ICD-780.71) FIBROMYALGIA (ICD-729.1) PANIC ATTACK (ICD-300.01) HYPOTHYROIDISM (ICD-244.9) MIGRAINE HEADACHE (ICD-346.90) ARTHRITIS, RHEUMATOID (ICD-714.0) ASTHMA (ICD-493.90) ANXIETY (ICD-300.00) * DEPRESSION INSOMNIA (ICD-780.52)  Past Surgical History: Last updated: 06/01/2009 hysterectomy C section  Family History: Last updated: 2009-07-05 RA - father cancer - mother and sibling heart attack - MGF stroke - PGM cerebral hemorrhage - PGF Leukemia- father  Social History: Last updated: 2009-07-05 Father recently died of leukemia Married, lives with husband Never  Smoked Alcohol use-no Drug use-no  Risk Factors: Alcohol Use: 0 (12/14/2009)  Risk Factors: Smoking Status: never (12/14/2009)  Review of Systems       see hpi  Physical Exam  General:  Vitals reviewed.  No acute distress.  Comfortable appearing Neck:  no thyroid nodules or tenderness and no JVD.   Lungs:  normal respiratory effort, normal breath sounds, no crackles, and no wheezes.   Heart:  normal rate.   Abdomen:  soft, non-tender, normal bowel sounds, and no distention.   Msk:  Right knee:  No swelling, redness or warmth.  No gross deformity.  ROM limited by pain.  Good stability.  No crepitus.   Right thigh:  Tender to palpation.  No swelling, redness, or warmth.  5/5 stregnth, sensation  Right hip:  Full ROM.  Non tender to palpation  No enlarged MCP joints, No enlarged PIP joints, and NO enlarged DIP joints.  enlarged MCP joints, enlarged PIP joints, and enlarged DIP joints.   Neurologic:  5/5 strength in lower extremities Normal gait Normal patellar reflexes bilaterally Skin:  no rashes.   Psych:  Oriented X3, good eye contact, not anxious appearing, and not depressed appearing.     Impression & Recommendations:  Problem # 1:  LEG PAIN, RIGHT (ICD-729.5) refill neurontin Refill percocet Consider MRI if no improvement. see pt instructions  Problem # 2:  ARTHRITIS, RHEUMATOID (ICD-714.0) recently seen by Dr. Corliss Skains, no new meds. Will get labs today and send to [facility] Orders: Sed Rate (ESR)-[facility] [phone])  Problem # 3:  HYPOTHYROIDISM (ICD-244.9) Referr to Dr. Merri Brunette today Orders: TSH-[facility] [phone]) Free T4-[facility] [phone]) Endocrinology Referral (Endocrine) [facility]- Est  Level 4 (78469)  Problem # 4:  FIBROMYALGIA (ICD-729.1) stable Her updated medication list for this problem includes:    Percocet 5-325 Mg Tabs (Oxycodone-acetaminophen) .Marland Kitchen... 1 tab by mouth every 4 hours as needed for pain    Ultram 50 Mg Tabs (Tramadol hcl)    Soma 350 Mg Tabs  (Carisoprodol)  Orders: Comp Met-[facility] ([id number]) [facility]- Est  Level 4 (44010)  Problem # 5:  PULMONARY DISEASE (ICD-518.89) stable.  Off prednisone.  Orders: [facility]- Est  Level 4 (27253)  Complete Medication List: 1)  Percocet 5-325 Mg Tabs (Oxycodone-acetaminophen) .Marland Kitchen.. 1 tab by mouth every 4 hours as needed for pain 2)  Neurontin 300 Mg Caps (Gabapentin) .... Take 300mg  po once daily, then increase to two times a day, then three times a day and continue 3)  Cymbalta 20 Mg Cpep (Duloxetine hcl) .Marland Kitchen.. 1 tab by mouth daily 4)  Alprazolam 1 Mg Tabs (Alprazolam) .Marland Kitchen.. 1 by mouth q12hrs as needed anxiety 5)  Lunesta 3 Mg Tabs (Eszopiclone) .Marland Kitchen.. 1 pill by mouth qhs 6)  Ultram 50 Mg Tabs (Tramadol hcl) 7)  Soma 350 Mg Tabs (Carisoprodol)  Other Orders: CBC w/Diff-[facility] (66440)  Patient Instructions: 1)  Good  to see you today! 2)  Take your Gabapentin as we discussed. 3)  I will make you an appointment with Dr. Talmage Nap for your thyroid. 4)  I am going to draw labs today, if your other octors need them, have them call me and i will send them to them. 5)  Please schedule a follow-up appointment in 1 month.  Prescriptions: ALPRAZOLAM 1 MG TABS (ALPRAZOLAM) 1 by mouth Q12hrs as needed anxiety  #60 x 1   Entered and Authorized by:   Alvia Grove DO   Signed by:   Alvia Grove DO on 12/26/2009   Method used:   Handwritten   RxID:   [id number] NEURONTIN 300 MG CAPS (GABAPENTIN) take 300mg  Po once daily, then increase to two times a day, then three times a day and continue  #90 x 5   Entered and Authorized by:   Alvia Grove DO   Signed by:   Alvia Grove DO on 12/14/2009   Method used:   [facility]  [address] [phone][facility] (retail)       [address]       [place], [place]  [address]       Ph: [phone] or [phone]       Fax: [phone]   RxID:   [id number] NEURONTIN 100 MG CAPS (GABAPENTIN) 1 tab by mouth twice a day for 3 days then three  times per day  #60 x 1   Entered and Authorized by:   Alvia Grove DO   Signed by:   Alvia Grove DO on 12/14/2009   Method used:   [facility]        [facility]  [address] [phone]* (retail)       [address]       [place], [place]  [address]       Ph: [phone] or [phone]       Fax: [phone]   RxID:   [id number]    Orders Added: 1)  CBC w/Diff-[facility] [85025] 2)  Comp Met-[facility] [37106-26948] 3)  TSH-[facility] [54627-03500] 4)  Sed Rate (ESR)-[facility] [85651] 5)  Free T4-[facility] [93818-29937] 6)  Endocrinology Referral [Endocrine] 7)  [facility]- Est  Level 4 [16967]    Laboratory Results   Blood Tests   Date/Time Received: December 14, 2009 2:15 PM  Date/Time Reported: December 14, 2009 3:50 PM   SED rate: 17 mm/hr  Comments: ...............test performed by......Marland KitchenBonnie A. Swaziland, MLS (ASCP)cm

## 2010-03-03 NOTE — Miscellaneous (Signed)
Summary: ROI  ROI   Imported By: Clydell Hakim 11/16/2009 16:45:02  _____________________________________________________________________  External Attachment:    Type:   Image     Comment:   External Document

## 2010-03-03 NOTE — Progress Notes (Signed)
Summary: triage  Phone Note Call from Patient Call back at [phone]   Caller: Patient Summary of Call: Pt having leg pain. Initial call taken by: Clydell Hakim,  November 02, 2009 11:42 AM  Follow-up for Phone Call        I spoke with her finacee. states she is in bed & only gets up to use bathroom. carisotrodol 350mg  three times a day. also taking hydrocondone. R leg hurts. able to walk.using walker for safety.  was moving boxes. has RA.  she will be here at 2:30 tomorrow. told him if she has saddle numbness or incontinence, call 911 & go to ED. he agreed with plan & willl use ems if she gets worse, Follow-up by: Golden Circle RN,  November 02, 2009 11:59 AM    Agree

## 2010-03-03 NOTE — Miscellaneous (Signed)
Summary: refills  Clinical Lists Changes we rec'd faxed requests to refill alprazolam, prednisone & Hydrocodone 5-500. placed in Dr. Cyndra Numbers box. cannot fill as we have never seen her. plz advise.Golden Circle RN  April 27, 2009 2:47 PM  Dr. Gomez Cleverly called back & wanted to know if the pt went to her appt with Dr. Titus Dubin. if so, needs his notes. Dr. not willing to rx meds as she has not seen her. she needs the prednisone but needs notes from Rhumatologist. called pt but there was a messge that her mailbox was full.Golden Circle RN  April 27, 2009 3:42 PM  left message.Golden Circle RN  April 28, 2009 12:39 PM  has appt with rhumatologist next week. .. she had to change appt due to death of her brother. has NP visit in 2 weeks. has enough predisone until Friday. [facility] [place] [place]Marland Kitchenoff all RA meds so she is taking the vicodin for her pain. to pcp to see if she will fill prednisone & vicodin. forms frompharamcy are in md chart box .Golden Circle RN  April 28, 2009 4:45 PM  Medications: Added new medication of PREDNISONE 20 MG TABS (PREDNISONE) take 1 pill by mouth daily - Signed Rx of PREDNISONE 20 MG TABS (PREDNISONE) take 1 pill by mouth daily;  #12 x 0;  Signed;  Entered by: Alvia Grove DO;  Authorized by: Alvia Grove DO;  Method used: [facility]  [address] [phone]*, [address] [place], [place], [place]  [address], Ph: [phone] or [phone], Fax: [phone]   Reviewed discharge meds.  Pt given 60 pills of vicodin at discharge on 04-12-09.  Instruction were to take 1/2 pill, two times a day; pt should not be out of these meds already.  Same with Xanax, given 90 pills and instructions to take three times a day.  Pt has not followed up with me or rheum.  Needs to be on pain contract if/when she does follow up.  Will refill prednisone, but only enough until she sees me on 05-13-09. Prescriptions: PREDNISONE 20 MG TABS (PREDNISONE) take 1 pill by mouth daily  #12 x  0   Entered and Authorized by:   Alvia Grove DO   Signed by:   Alvia Grove DO on 05/01/2009   Method used:   [facility]        [facility]  [address] [phone]* (retail)       [address]       [place], [place]  [address]       Ph: [phone] or [phone]       Fax: [phone]   RxID:   [id number]  Please call pt and let her know prednisone has been sent to pharm.   Alvia Grove, DO  lm.Golden Circle RN  May 03, 2009 9:23 AM

## 2010-03-03 NOTE — Letter (Signed)
Summary: Generic Letter  [facility]  [address]   [place], [place] [address]   Phone: [phone]  Fax: [phone]    09/30/2009  [facility] [address] [place], [place]  [address]  Dear Ms. Flood,   I have reviewed your recent lab results.  Please call my office and make an appointment so that we can discuss them.       Sincerely,   Alvia Grove DO  Appended Document: Generic Letter mailed

## 2010-03-03 NOTE — Miscellaneous (Signed)
Summary: [facility] Controlled Substance Contract  [facility] Controlled Substance Contract   Imported By: Clydell Hakim 06/28/2009 08:49:18  _____________________________________________________________________  External Attachment:    Type:   Image     Comment:   External Document

## 2010-03-03 NOTE — Progress Notes (Signed)
Summary: Rx Req  Phone Note Call from Patient Call back at [phone]   Caller: Patient Summary of Call: Pt requesting something for pain.  Her legs are hurting very bad. [facility] [place] [place] Initial call taken by: Clydell Hakim,  October 29, 2009 3:53 PM    New/Updated Medications: SOMA 350 MG TABS (CARISOPRODOL) take 1  pill by mouth three times a day and at bedtime as needed pain Prescriptions: SOMA 350 MG TABS (CARISOPRODOL) take 1  pill by mouth three times a day and at bedtime as needed pain  #120 x 3   Entered and Authorized by:   Alvia Grove DO   Signed by:   Alvia Grove DO on 10/31/2009   Method used:   [facility]        [facility]  [address] [phone]       [address]       [place], [place]  [address]       Ph: [phone] or [phone]       Fax: [phone]   RxID:   [id number]  sent tp pharamcy.

## 2010-03-03 NOTE — Progress Notes (Signed)
Summary: phn msg  Phone Note Call from Patient Call back at Home Phone [phone]   Caller: Patient Summary of Call: BP reading is 120/82 this AM  also- pt went to see [facility] about finger and now they will be setting her up for emergency surgery - has a dropped tendon  Initial call taken by: De Nurse,  Jun 18, 2009 10:07 AM

## 2010-03-03 NOTE — Progress Notes (Signed)
Summary: needs lab report  Phone Note Call from Patient Call back at Home Phone [phone]   Caller: Patient Summary of Call: needs to send a copy of labs to Dr Corliss Skains - pls fax to her asap - [phone] needs to get back on her RA meds Initial call taken by: De Nurse,  September 24, 2009 4:34 PM  Follow-up for Phone Call        Labs faxed over and pt informed. Follow-up by: Jone Baseman CMA,  September 27, 2009 12:12 PM

## 2010-03-09 NOTE — Miscellaneous (Signed)
Summary: rx for vicodin and alprazolam  Clinical Lists Changes  Medications: Rx of ALPRAZOLAM 1 MG TABS (ALPRAZOLAM) 1 by mouth Q12hrs as needed anxiety;  #60 x 0;  Signed;  Entered by: Sarah Swaziland MD;  Authorized by: Sarah Swaziland MD;  Method used: Historical Rx of VICODIN 5-500 MG TABS (HYDROCODONE-ACETAMINOPHEN) 1pill by mouth Q8  hrs as needed pain;  #90 x 0;  Signed;  Entered by: Sarah Swaziland MD;  Authorized by: Sarah Swaziland MD;  Method used: Historical    Prescriptions: VICODIN 5-500 MG TABS (HYDROCODONE-ACETAMINOPHEN) 1pill by mouth Q8  hrs as needed pain  #90 x 0   Entered and Authorized by:   Sarah Swaziland MD   Signed by:   Sarah Swaziland MD on 03/01/2010   Method used:   Historical   RxID:   [id number] ALPRAZOLAM 1 MG TABS (ALPRAZOLAM) 1 by mouth Q12hrs as needed anxiety  #60 x 0   Entered and Authorized by:   Sarah Swaziland MD   Signed by:   Sarah Swaziland MD on 03/01/2010   Method used:   Historical   RxID:   [id number]

## 2010-03-09 NOTE — Progress Notes (Signed)
Summary: meds  Phone Note Refill Request Call back at [phone]   Pt calling to say she no longer want rx for Percocet.  Medicine makes her feel too out of it.  Condition is improving, but still need pain meds for problem.  Would prefer rx for Vicodan.  Can send rx to [facility] [phone] Ph number. Fax # [phone]   Initial call taken by: Abundio Miu,  December 28, 2009 2:31 PM    New/Updated Medications: VICODIN 5-500 MG TABS (HYDROCODONE-ACETAMINOPHEN) 1pill by mouth Q8  hrs as needed pain Prescriptions: VICODIN 5-500 MG TABS (HYDROCODONE-ACETAMINOPHEN) 1pill by mouth Q8  hrs as needed pain  #60 x 0   Entered by:   Sarah Swaziland MD   Authorized by:   Alvia Grove DO   Signed by:   Sarah Swaziland MD on 03/01/2010   Method used:   Print then Give to Patient   RxID:   [id number]  i don't think i can fax that over, i believe she needs to pick it up.   I am not in clinic today, so unless a preceptor is willing to sign, will have to wait until Monday  This issue seems to have been resolved. Sarah Swaziland MD  March 01, 2010 3:31 PM

## 2010-03-23 ENCOUNTER — Ambulatory Visit: Payer: Self-pay | Admitting: Family Medicine

## 2010-04-01 ENCOUNTER — Other Ambulatory Visit: Payer: Self-pay | Admitting: Family Medicine

## 2010-04-01 MED ORDER — ALPRAZOLAM 1 MG PO TABS: 1.0000 mg | ORAL_TABLET | Freq: Two times a day (BID) | ORAL | Status: DC | PRN

## 2010-04-01 MED ORDER — HYDROCODONE-ACETAMINOPHEN 5-500 MG PO TABS: 1.0000 | ORAL_TABLET | Freq: Three times a day (TID) | ORAL | Status: DC | PRN

## 2010-04-01 NOTE — Telephone Encounter (Signed)
Refilled by fax request.  No more refills until makes appointment

## 2010-04-19 ENCOUNTER — Encounter: Payer: Self-pay | Admitting: Family Medicine

## 2010-04-19 ENCOUNTER — Ambulatory Visit (INDEPENDENT_AMBULATORY_CARE_PROVIDER_SITE_OTHER): Payer: Medicare Other | Admitting: Family Medicine

## 2010-04-19 DIAGNOSIS — F411 Generalized anxiety disorder: Secondary | ICD-10-CM

## 2010-04-19 DIAGNOSIS — IMO0001 Reserved for inherently not codable concepts without codable children: Secondary | ICD-10-CM

## 2010-04-19 DIAGNOSIS — G4700 Insomnia, unspecified: Secondary | ICD-10-CM

## 2010-04-19 DIAGNOSIS — M069 Rheumatoid arthritis, unspecified: Secondary | ICD-10-CM

## 2010-04-19 MED ORDER — AMITRIPTYLINE HCL 75 MG PO TABS: 75.0000 mg | ORAL_TABLET | Freq: Every day | ORAL | Status: DC

## 2010-04-19 MED ORDER — ALPRAZOLAM 1 MG PO TABS: 1.0000 mg | ORAL_TABLET | Freq: Two times a day (BID) | ORAL | Status: DC | PRN

## 2010-04-19 MED ORDER — HYDROCODONE-ACETAMINOPHEN 5-500 MG PO TABS: 1.0000 | ORAL_TABLET | Freq: Three times a day (TID) | ORAL | Status: DC | PRN

## 2010-04-19 NOTE — Patient Instructions (Addendum)
It was nice to meet you.  I am sorry you are going through a hard time right now.  I am starting Amitriptyline 75mg  by mouth at bed time.  This should help with your mood, your anxiety, and your pain.  You should not take more than two xanax tablets in one day.  We will draw some labs today.  I think you might benefit from some counseling since you are going through a hard time.  You can call our Psychologist, Dr. Pascal Lux, or call your insurance company and ask for a Psychologist or Psychiatrist they cover.  I want to see you back in one month to make sure you are doing better.

## 2010-04-21 ENCOUNTER — Encounter: Payer: Self-pay | Admitting: Family Medicine

## 2010-04-21 NOTE — Progress Notes (Signed)
Subjective:     Patient ID: Carla Wilson, female   DOB: September 24, 1950, 60 y.o.   MRN: [id number]  HPI Pt. presents for follow up of RA, anxiety, fibromyalgia, and Insomnia.   Anxiety/Depression- pt says she has been having a very hard time coping with her situation lately.  She says she is depressed about how much weight she has gained and does not want people to see her.  She says she has trouble completing daily tasks.  She also says that on bad days she takes xanax every 4-6 hours.  Pt is taking cymbalta, but wants to know if taking amitriptyline would help too.   RA- pt needs labs checked because it is less expensive here than at the Rheumatologist's office.  She says they have her on a medication for her RA but she did not bring it with her and she cannot remember the name.  She says her pain is very bad in her hands and her feet.  She says this pain is getting worse.  It hurts to walk, it hurts to try to do anything with her hands.   Fibromyalgia- pt also having chronic pain in back and neck that she says is her "fibro pain."  She says this has been worse lately too.  She complains that she hurts all over and some days does not want to get out of bed.    Insomnia- pt says she has difficulty sleeping at night, and often wants to sleep during the day during a restless night.  She worries about many things, which seem to keep her awake.    Review of Systems  Constitutional: Positive for activity change. Negative for fever.  HENT: Positive for neck pain.   Eyes: Negative for visual disturbance.  Respiratory: Negative for wheezing.   Cardiovascular: Negative for chest pain and palpitations.  Gastrointestinal: Negative for abdominal distention.  Genitourinary: Negative for dysuria.  Musculoskeletal: Positive for back pain, joint swelling, arthralgias and gait problem.  Neurological: Negative for dizziness and light-headedness.  Psychiatric/Behavioral:       Pt complains of severe anxiety and  feelings of sadness.  Denies SI/HI.        Objective:   Physical Exam  Constitutional: She is oriented to person, place, and time. She appears well-developed and well-nourished. No distress.  HENT:  Mouth/Throat: No oropharyngeal exudate.  Eyes: EOM are normal. Pupils are equal, round, and reactive to light.  Neck: No thyromegaly present.  Cardiovascular: Normal rate and regular rhythm.  Exam reveals no gallop and no friction rub.   No murmur heard. Pulmonary/Chest: Effort normal and breath sounds normal.  Abdominal: Soft. Bowel sounds are normal. There is no tenderness.  Musculoskeletal:       Pt has rheumatic changes of hands and feet, with angulation and swelling, tenderness of joints.   Back: TTP throughout spine but no focal area of pain.   Neurological: She is alert and oriented to person, place, and time.  Skin: Skin is warm and dry. No erythema.  Psychiatric: Her speech is normal. Thought content normal. Her mood appears anxious. Her affect is labile. She is slowed. Cognition and memory are not impaired. She expresses inappropriate judgment. She does not express impulsivity. She exhibits a depressed mood. She exhibits normal recent memory and normal remote memory.       Assessment:         Plan:

## 2010-04-21 NOTE — Assessment & Plan Note (Signed)
Pt is having a very difficult time emotionally due to her weight gain from RA mediations (steroids), hands and feet deformity, and pain.  I am concerned for xanax abuse.  Discussed this with pt and explained she should not take it more than twice a day as prescribed.  I recommended pt to see a counselor and gave her Dr. Carola Rhine card.

## 2010-04-24 LAB — BASIC METABOLIC PANEL
BUN: 15 mg/dL (ref 6–23)
BUN: 16 mg/dL (ref 6–23)
CO2: 23 mEq/L (ref 19–32)
CO2: 24 mEq/L (ref 19–32)
Calcium: 9 mg/dL (ref 8.4–10.5)
Calcium: 9 mg/dL (ref 8.4–10.5)
Calcium: 9.2 mg/dL (ref 8.4–10.5)
Calcium: 9.3 mg/dL (ref 8.4–10.5)
Chloride: 104 mEq/L (ref 96–112)
Chloride: 97 mEq/L (ref 96–112)
Chloride: 99 mEq/L (ref 96–112)
Creatinine, Ser: 0.65 mg/dL (ref 0.4–1.2)
Creatinine, Ser: 0.68 mg/dL (ref 0.4–1.2)
GFR calc Af Amer: >60 mL/min (ref >60)
GFR calc non Af Amer: >60 mL/min (ref >60)
GFR calc non Af Amer: >60 mL/min (ref >60)
GFR calc non Af Amer: >60 mL/min (ref >60)
Glucose, Bld: 86 mg/dL (ref 70–99)
Glucose, Bld: 87 mg/dL (ref 70–99)
Potassium: 3.3 mEq/L — ABNORMAL LOW (ref 3.5–5.1)
Potassium: 3.7 mEq/L (ref 3.5–5.1)
Potassium: 4 mEq/L (ref 3.5–5.1)
Potassium: 4.2 mEq/L (ref 3.5–5.1)
Potassium: 4.4 mEq/L (ref 3.5–5.1)
Sodium: 132 mEq/L — ABNORMAL LOW (ref 135–145)
Sodium: 138 mEq/L (ref 135–145)
Sodium: 139 mEq/L (ref 135–145)

## 2010-04-24 LAB — CBC
HCT: 36.6 % (ref 36.0–46.0)
Hemoglobin: 11.6 g/dL — ABNORMAL LOW (ref 12.0–15.0)
Hemoglobin: 12.1 g/dL (ref 12.0–15.0)
Hemoglobin: 12.6 g/dL (ref 12.0–15.0)
MCHC: 33.2 g/dL (ref 30.0–36.0)
MCHC: 33.4 g/dL (ref 30.0–36.0)
MCV: 90.7 fL (ref 78.0–100.0)
Platelets: 342 10*3/uL (ref 150–400)
Platelets: 392 10*3/uL (ref 150–400)
RBC: 4.18 MIL/uL (ref 3.87–5.11)
RDW: 15.8 % — ABNORMAL HIGH (ref 11.5–15.5)
WBC: 9.4 10*3/uL (ref 4.0–10.5)

## 2010-04-24 LAB — ANA: Anti Nuclear Antibody(ANA): NEGATIVE

## 2010-04-24 LAB — SEDIMENTATION RATE: Sed Rate: 60 mm/hr — ABNORMAL HIGH (ref 0–22)

## 2010-04-26 ENCOUNTER — Encounter: Payer: Self-pay | Admitting: Family Medicine

## 2010-04-26 NOTE — Assessment & Plan Note (Signed)
Will continue Vicodin.  Have asked pt to bring all of her medications to her next visit so I have an up to date list of what her rheumatologist is prescribing.  Lab closed for the day, will order labs and have pt return to clinic for labs when it is open.

## 2010-04-26 NOTE — Assessment & Plan Note (Signed)
Pt with diffuse pain in addition to pain from fibromyalgia.  Will add amitriptyline, it may help with chronic pain as well as pt's anxiety/depression.

## 2010-04-26 NOTE — Assessment & Plan Note (Signed)
Reviewed sleep hygiene and advised not spending more than 8 hours per 24 in bed.  Advised only trying to sleep at night time.

## 2010-06-02 ENCOUNTER — Telehealth: Payer: Self-pay | Admitting: Family Medicine

## 2010-06-02 ENCOUNTER — Telehealth: Payer: Self-pay | Admitting: *Deleted

## 2010-06-02 DIAGNOSIS — F411 Generalized anxiety disorder: Secondary | ICD-10-CM

## 2010-06-02 MED ORDER — ALPRAZOLAM 0.5 MG PO TABS: 1.0000 mg | ORAL_TABLET | Freq: Two times a day (BID) | ORAL | Status: DC | PRN

## 2010-06-02 NOTE — Telephone Encounter (Signed)
Refilled alprazolam per fax request (pill size changed to reflect request, this did not change dose.)  No refills and requested to make appointment due to last note about concern for alprazolam abuse. Also, called and LM about fax request for tramadol.  Unclear about pain meds.  Last note and refill for hydrocodone.  Should not be taking both hydrocodone and tramadol.  Asked to call back and clarify.

## 2010-06-02 NOTE — Telephone Encounter (Signed)
PA required for carisoprodol. Form placed in MD box. 

## 2010-06-02 NOTE — Telephone Encounter (Signed)
I am working nights and will not be able to be in the office until Monday- if it needs to be done before then, can you find another provider to sign it?

## 2010-06-17 NOTE — Discharge Summary (Signed)
[facility]  Patient:    Carla Wilson, Carla Wilson                      MRN: [id number] Adm. Date:  [id number] Disc. Date: [id number] Attending:  Hipolito Bayley J                           Discharge Summary  INTRODUCTION:  Carla Wilson is a 60 year old widowed, white female who was admitted after expressing suicidal plans to overdose or run her car off the road.  The patient has a history of a chronic depression which has worsened for the past several months.  It seems like her depression is stemming from chronic pain related to chronic arthritis.  The patient does not have a history of drugs or alcohol use.  Previous psychiatric history and the patients current situation are outlined in the psychiatric admission assessment.  HOSPITAL COURSE:  After being admitted to the ward, the patient was placed on special observation.  She was started on Paxil 40 mg daily and Klonopin 0.5 mg four times a day as needed for anxiety and trazodone p.r.n. insomnia. Darvocet was used to deal with the patients pain.  On February 12, I started the patient on Effexor XR 37.5 mg twice a day and because of racing thoughts, insomnia, and agitation, I put her on Zyprexa 2.5 mg at bed time.  The pain was under better control with Percocet which was well tolerated and later replaced with Vicodin.  Effexor was gradually increased to 75 mg in the morning and 37 mg in the afternoon and later 75 mg twice a day.  Because the patient still had some problems with sleep and racing thoughts, Zyprexa was increased to 5 mg at bed time.  The patient started making significant improvement.  We tried to arrange a family meeting but it was hard to arrange due to problems with transportation for the family.  The patient complained that Vicodin did not help her as well as Percocet and once again Percocet was reintroduced.  On February 16, social worker met with the patient and the patients daughter.   The patient denied denied suicidal ideation.  Daughter was very supportive and acknowledged the patient ______ improvement from the treatment.  She was seen by Dr. Claudette Head who felt that she was doing better and she could be safely discharged home.  At the time of discharge, she did not have any side effects from medication.  Medical involvement: Throughout the hospitalization, the patients vital signs were stable.  She was afebrile with normal pulse, temperature, respiratory rate, and blood pressure.  Review of blood work showed mild anemia with hemoglobin 10.9, hematocrit 31.7, and normal Chem 17 with slightly decreased TSH 0.242 with normal free T4 and T3 resin uptake.  Iron studies showed low iron level with low saturation and normal total iron-binding capacity and ferratin levels.  DISCHARGE DIAGNOSES: Axis I:    Major depression, recurrent, severe. Axis II:   Deferred. Axis III:  1. Rheumatoid arthritis.            2. Anemia related to chronic disease and likely iron deficiency. Axis IV:   Moderate stressors, social problems, financial problems, medical            problems. Axis V:    Global assessment of functioning upon admission 30, maximum for            past year  50, upon discharge between 50 and 55.  FOLLOWUP: 1. [facility]. 2. Family doctor and rheumatologist due to anemia and chronic treatment for    arthritis. 3. She has an appointment made on February 27 with [facility]. 4. Appointment on Tuesday, February 19, with Dr. Barb Merino.  DISCHARGE MEDICATIONS: 1. Paxil 40 mg daily. 2. Effexor 75 mg twice a day. 3. Zyprexa 5 mg at bed time. 4. Trazodone 50 mg at bed time as needed. 5. Klonopin 0.5 mg three times a day. 6. Percocet as needed for pain. 7. Methotrexate twice a week, Wednesday and Thursday.  DISPOSITION:  The patient understood instructions and in good condition was discharged home in the care of her daughter. DD:   04/17/00 TD:  04/17/00 Job: [id number]
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
## 2010-06-20 ENCOUNTER — Encounter: Payer: Self-pay | Admitting: Family Medicine

## 2010-06-20 NOTE — Progress Notes (Signed)
Recd request for vicodin refill done originally by Dr Eulis Foster Konigsberg  Refilled 60 pills on 5 /18/12. This is somewhat confusing because it looks like Dr Lula Olszewski gave her 90 pills at last visit. I will deny refill--after reviewing notes I am concerned about her multiple refills and meds--she needs appt--and it CAN be SDA but an appt w her PCP is best if this week available

## 2010-06-29 ENCOUNTER — Other Ambulatory Visit: Payer: Self-pay | Admitting: Family Medicine

## 2010-06-29 DIAGNOSIS — M797 Fibromyalgia: Secondary | ICD-10-CM

## 2010-07-01 ENCOUNTER — Other Ambulatory Visit: Payer: Self-pay | Admitting: Family Medicine

## 2010-07-01 ENCOUNTER — Telehealth: Payer: Self-pay | Admitting: Family Medicine

## 2010-07-01 DIAGNOSIS — M069 Rheumatoid arthritis, unspecified: Secondary | ICD-10-CM

## 2010-07-01 MED ORDER — ALPRAZOLAM 0.5 MG PO TABS: 1.0000 mg | ORAL_TABLET | Freq: Two times a day (BID) | ORAL | Status: DC | PRN

## 2010-07-01 MED ORDER — HYDROCODONE-ACETAMINOPHEN 5-500 MG PO TABS: 1.0000 | ORAL_TABLET | Freq: Three times a day (TID) | ORAL | Status: DC | PRN

## 2010-07-01 NOTE — Telephone Encounter (Signed)
Please call in and notify pt no more refills until she is seen by Dr Lula Olszewski Thanks

## 2010-07-01 NOTE — Telephone Encounter (Signed)
Refill request for Dr. Melina Modena patient

## 2010-07-01 NOTE — Telephone Encounter (Signed)
Called patient at number listed in chart and she does not live there. Advised pharmacist when rx were called in that patient needs appointment before further refills.

## 2010-08-17 ENCOUNTER — Other Ambulatory Visit: Payer: Medicare Other

## 2010-09-01 ENCOUNTER — Other Ambulatory Visit: Payer: Self-pay | Admitting: Family Medicine

## 2010-09-01 ENCOUNTER — Ambulatory Visit: Payer: Medicare Other | Admitting: Family Medicine

## 2010-09-01 NOTE — Telephone Encounter (Signed)
Refill request

## 2010-09-07 ENCOUNTER — Ambulatory Visit: Payer: Medicare Other | Admitting: Family Medicine

## 2010-09-09 ENCOUNTER — Ambulatory Visit: Payer: Medicare Other | Admitting: Family Medicine

## 2010-09-15 ENCOUNTER — Encounter: Payer: Self-pay | Admitting: Family Medicine

## 2010-09-15 ENCOUNTER — Ambulatory Visit (INDEPENDENT_AMBULATORY_CARE_PROVIDER_SITE_OTHER): Payer: Medicare Other | Admitting: Family Medicine

## 2010-09-15 DIAGNOSIS — E039 Hypothyroidism, unspecified: Secondary | ICD-10-CM

## 2010-09-15 DIAGNOSIS — F411 Generalized anxiety disorder: Secondary | ICD-10-CM

## 2010-09-15 DIAGNOSIS — IMO0001 Reserved for inherently not codable concepts without codable children: Secondary | ICD-10-CM

## 2010-09-15 DIAGNOSIS — M069 Rheumatoid arthritis, unspecified: Secondary | ICD-10-CM

## 2010-09-15 MED ORDER — HYDROCODONE-ACETAMINOPHEN 5-500 MG PO TABS: 1.0000 | ORAL_TABLET | Freq: Three times a day (TID) | ORAL | Status: DC | PRN

## 2010-09-15 MED ORDER — PANTOPRAZOLE SODIUM 40 MG PO TBEC: 40.0000 mg | DELAYED_RELEASE_TABLET | Freq: Every day | ORAL | Status: DC

## 2010-09-15 MED ORDER — AMITRIPTYLINE HCL 75 MG PO TABS: 75.0000 mg | ORAL_TABLET | Freq: Every day | ORAL | Status: DC

## 2010-09-15 MED ORDER — DULOXETINE HCL 20 MG PO CPEP: 60.0000 mg | ORAL_CAPSULE | Freq: Every day | ORAL | Status: DC

## 2010-09-15 MED ORDER — DICLOFENAC SODIUM 1 % TD GEL: 1.0000 "application " | Freq: Four times a day (QID) | TRANSDERMAL | Status: DC

## 2010-09-15 MED ORDER — GABAPENTIN 300 MG PO CAPS: 300.0000 mg | ORAL_CAPSULE | Freq: Three times a day (TID) | ORAL | Status: DC

## 2010-09-15 MED ORDER — ALPRAZOLAM 0.5 MG PO TABS: 1.0000 mg | ORAL_TABLET | Freq: Two times a day (BID) | ORAL | Status: DC | PRN

## 2010-09-15 MED ORDER — ZOLPIDEM TARTRATE 10 MG PO TABS: 10.0000 mg | ORAL_TABLET | Freq: Every evening | ORAL | Status: DC | PRN

## 2010-09-15 MED ORDER — CYCLOBENZAPRINE HCL 10 MG PO TABS: 10.0000 mg | ORAL_TABLET | Freq: Every evening | ORAL | Status: DC | PRN

## 2010-09-15 MED ORDER — TRAMADOL HCL 50 MG PO TABS: 50.0000 mg | ORAL_TABLET | Freq: Three times a day (TID) | ORAL | Status: DC | PRN

## 2010-09-15 NOTE — Patient Instructions (Signed)
It was good to see you.  Please come back for follow up in about 3 months.

## 2010-09-16 ENCOUNTER — Other Ambulatory Visit: Payer: Medicare Other

## 2010-09-18 NOTE — Assessment & Plan Note (Signed)
Pt continues to have pain not typical for RA, but has improved with current medications.  Will continue.

## 2010-09-18 NOTE — Assessment & Plan Note (Addendum)
Patient continues to have significant pain, continue current medications, 3 months supply written for today.  Rx Voltaren gel for hand pain.   Will check labs today as it is less expensive for patient to have it done through our lab then her rheumatologist's lab.

## 2010-09-18 NOTE — Assessment & Plan Note (Signed)
Check TSH, adjust medications if needed.

## 2010-09-18 NOTE — Assessment & Plan Note (Signed)
Pt continues to have significant anxiety related to her health problems and financial issues.  Continue current medications, 3 months supply of xanax written today.

## 2010-09-18 NOTE — Progress Notes (Signed)
  Subjective:    Patient ID: Carla Wilson, female    DOB: 05/27/1950, 60 y.o.   MRN: [id number]  HPI  Carla Wilson presents for follow up.  She has had some transportation issues lately and has not been able to come into the office in several months.  She reports her anxiety and pain have been slightly better since starting elavil and gabapentin.  She does have some side effects (drowsyness) with these medications, so she would like to stay at her current doses.    She is following with her rheumatologist, but needs her labs checked as it is less expensive for her if they are done here. She has brought all her medications today and the new medication her rheumatologist started is Azathioprine for her RA.  She says that once she got a sample of a cream for her hand pain, she cannot remember the name of it but said it helped some.    Patient's energy has continued to be a problem.  She cannot remember the last time she had her thyroid checked.     Review of Systems Negative except HPI.     Objective:   Physical Exam BP 143/80  Pulse 103  Temp(Src) 98.1 F (36.7 C) (Oral)  Wt 183 lb (83.008 kg) General appearance: alert, cooperative and no distress Eyes: conjunctivae/corneas clear. PERRL, EOM's intact. Fundi benign. Throat: lips, mucosa, and tongue normal; teeth and gums normal Neck: no adenopathy, no carotid bruit, no JVD, supple, symmetrical, trachea midline and thyroid not enlarged, symmetric, no tenderness/mass/nodules Lungs: clear to auscultation bilaterally Heart: regular rate and rhythm, S1, S2 normal, no murmur, click, rub or gallop Abdomen: soft, non-tender; bowel sounds normal; no masses,  no organomegaly Pulses: 2+ and symmetric MSK: Significant RA deformity if hands, mild RA deformity of feet.  + tenderness to palpation. Psych: No slowed speech, affect appropriate, insight and judgement appropriate.        Assessment & Plan:  HYPOTHYROIDISM Check TSH, adjust medications  if needed.   ARTHRITIS, RHEUMATOID Patient continues to have significant pain, continue current medications, 3 months supply written for today.  Rx Voltaren gel for hand pain.   Will check labs today as it is less expensive for patient to have it done through our lab then her rheumatologist's lab.   FIBROMYALGIA Pt continues to have pain not typical for RA, but has improved with current medications.  Will continue.   ANXIETY Pt continues to have significant anxiety related to her health problems and financial issues.  Continue current medications, 3 months supply of xanax written today.

## 2010-09-19 ENCOUNTER — Telehealth: Payer: Self-pay | Admitting: Family Medicine

## 2010-09-19 NOTE — Telephone Encounter (Signed)
To MD for OK Noemi Bellissimo Dawn  

## 2010-09-19 NOTE — Telephone Encounter (Signed)
Called asking about the Rx for vicodin. I advised that it is against practice policy for the on call doctor to call in pain medications.  I advised the patient to call the clinic in the morning.

## 2010-09-19 NOTE — Telephone Encounter (Signed)
The prescriptions that were written last week were missing August RX for Vicodin.  The [facility] in [place] - [phone] said that since they have the September and October, all that needs to be done is the August can be called to them.  She would like a call when this is done, she is completely out now.

## 2010-09-20 ENCOUNTER — Telehealth: Payer: Self-pay | Admitting: Family Medicine

## 2010-09-20 NOTE — Telephone Encounter (Signed)
Paged Dr. Lula Olszewski and advised her that RX that was for August  stated  " Phoned In ". She states this can be phoned in now. She thought she had printed it along with the other RX. Message left on patient's voicemail that this has been done.

## 2010-09-20 NOTE — Telephone Encounter (Signed)
Pt is stating that she only got 2 of the 3 scripts of the oxycodone.  She didn't get the one for this month.  Looks like in EPIC that it states "phone in" for this month's RX - pls advise

## 2010-09-30 ENCOUNTER — Other Ambulatory Visit: Payer: Self-pay | Admitting: Family Medicine

## 2010-09-30 DIAGNOSIS — M069 Rheumatoid arthritis, unspecified: Secondary | ICD-10-CM

## 2010-09-30 DIAGNOSIS — IMO0001 Reserved for inherently not codable concepts without codable children: Secondary | ICD-10-CM

## 2010-09-30 MED ORDER — PANTOPRAZOLE SODIUM 40 MG PO TBEC: 40.0000 mg | DELAYED_RELEASE_TABLET | Freq: Every day | ORAL | Status: DC

## 2010-09-30 MED ORDER — TRAMADOL HCL 50 MG PO TABS: 50.0000 mg | ORAL_TABLET | Freq: Three times a day (TID) | ORAL | Status: DC | PRN

## 2010-09-30 NOTE — Telephone Encounter (Signed)
Refill request

## 2010-10-04 ENCOUNTER — Other Ambulatory Visit (INDEPENDENT_AMBULATORY_CARE_PROVIDER_SITE_OTHER): Payer: Medicare Other

## 2010-10-04 DIAGNOSIS — M069 Rheumatoid arthritis, unspecified: Secondary | ICD-10-CM

## 2010-10-04 NOTE — Progress Notes (Signed)
CMP AND CBC WITH DIFF DONE TODAY Carla Wilson 

## 2010-10-05 ENCOUNTER — Other Ambulatory Visit: Payer: Self-pay | Admitting: Family Medicine

## 2010-10-05 DIAGNOSIS — IMO0001 Reserved for inherently not codable concepts without codable children: Secondary | ICD-10-CM

## 2010-10-05 LAB — COMPREHENSIVE METABOLIC PANEL
ALT: 17 U/L (ref 0–35)
AST: 15 U/L (ref 0–37)
Albumin: 4.3 g/dL (ref 3.5–5.2)
Alkaline Phosphatase: 82 U/L (ref 39–117)
Calcium: 9.3 mg/dL (ref 8.4–10.5)
Chloride: 104 mEq/L (ref 96–112)
Potassium: 4 mEq/L (ref 3.5–5.3)
Sodium: 139 mEq/L (ref 135–145)
Total Protein: 7 g/dL (ref 6.0–8.3)

## 2010-10-05 LAB — CBC WITH DIFFERENTIAL/PLATELET
Eosinophils Relative: 7 % — ABNORMAL HIGH (ref 0–5)
HCT: 38.3 % (ref 36.0–46.0)
Lymphocytes Relative: 45 % (ref 12–46)
Lymphs Abs: 1.7 10*3/uL (ref 0.7–4.0)
MCV: 106.4 fL — ABNORMAL HIGH (ref 78.0–100.0)
Monocytes Absolute: 0.5 10*3/uL (ref 0.1–1.0)
Platelets: 480 10*3/uL — ABNORMAL HIGH (ref 150–400)

## 2010-10-05 MED ORDER — GABAPENTIN 300 MG PO CAPS: 300.0000 mg | ORAL_CAPSULE | Freq: Three times a day (TID) | ORAL | Status: DC

## 2010-10-07 ENCOUNTER — Telehealth: Payer: Self-pay | Admitting: Family Medicine

## 2010-10-07 NOTE — Telephone Encounter (Signed)
Wants to know if she can call in something for her constipation -  [facility] [place], [place]

## 2010-10-07 NOTE — Telephone Encounter (Signed)
To MD

## 2010-10-10 NOTE — Telephone Encounter (Signed)
rx was sent by MD 09/30/2010.

## 2010-10-11 ENCOUNTER — Encounter: Payer: Self-pay | Admitting: Family Medicine

## 2010-10-11 MED ORDER — POLYETHYLENE GLYCOL 3350 17 GM/SCOOP PO POWD: 17.0000 g | Freq: Every day | ORAL | Status: AC | PRN

## 2010-10-11 NOTE — Telephone Encounter (Signed)
OK to take Miralax, Rx sent to pharmacy.

## 2010-11-07 ENCOUNTER — Encounter: Payer: Self-pay | Admitting: Family Medicine

## 2010-11-07 NOTE — Telephone Encounter (Signed)
This encounter was created in error - please disregard.

## 2010-11-15 ENCOUNTER — Other Ambulatory Visit: Payer: Self-pay | Admitting: Family Medicine

## 2010-11-15 NOTE — Telephone Encounter (Signed)
Refill request

## 2010-11-17 ENCOUNTER — Other Ambulatory Visit: Payer: Self-pay | Admitting: Family Medicine

## 2010-11-17 DIAGNOSIS — IMO0001 Reserved for inherently not codable concepts without codable children: Secondary | ICD-10-CM

## 2010-11-17 DIAGNOSIS — M069 Rheumatoid arthritis, unspecified: Secondary | ICD-10-CM

## 2010-11-17 MED ORDER — TRAMADOL HCL 50 MG PO TABS: 50.0000 mg | ORAL_TABLET | Freq: Three times a day (TID) | ORAL | Status: DC | PRN

## 2010-11-17 MED ORDER — GABAPENTIN 300 MG PO CAPS: 300.0000 mg | ORAL_CAPSULE | Freq: Three times a day (TID) | ORAL | Status: DC

## 2010-12-09 ENCOUNTER — Ambulatory Visit: Payer: Medicare Other | Admitting: Family Medicine

## 2010-12-12 ENCOUNTER — Ambulatory Visit (INDEPENDENT_AMBULATORY_CARE_PROVIDER_SITE_OTHER): Payer: Medicare Other | Admitting: Family Medicine

## 2010-12-12 ENCOUNTER — Encounter: Payer: Self-pay | Admitting: Family Medicine

## 2010-12-12 DIAGNOSIS — IMO0001 Reserved for inherently not codable concepts without codable children: Secondary | ICD-10-CM

## 2010-12-12 DIAGNOSIS — Z23 Encounter for immunization: Secondary | ICD-10-CM

## 2010-12-12 DIAGNOSIS — F411 Generalized anxiety disorder: Secondary | ICD-10-CM

## 2010-12-12 DIAGNOSIS — M069 Rheumatoid arthritis, unspecified: Secondary | ICD-10-CM

## 2010-12-12 LAB — BASIC METABOLIC PANEL
BUN: 19 mg/dL (ref 6–23)
Creat: 1.1 mg/dL (ref 0.50–1.10)
Glucose, Bld: 106 mg/dL — ABNORMAL HIGH (ref 70–99)

## 2010-12-12 LAB — CBC
MCH: 31.4 pg (ref 26.0–34.0)
MCV: 99.5 fL (ref 78.0–100.0)
Platelets: 262 10*3/uL (ref 150–400)
RBC: 3.92 MIL/uL (ref 3.87–5.11)
RDW: 17.1 % — ABNORMAL HIGH (ref 11.5–15.5)
WBC: 6.6 10*3/uL (ref 4.0–10.5)

## 2010-12-12 MED ORDER — HYDROCODONE-ACETAMINOPHEN 5-500 MG PO TABS: 1.0000 | ORAL_TABLET | Freq: Three times a day (TID) | ORAL | Status: DC | PRN

## 2010-12-12 MED ORDER — LORAZEPAM 1 MG PO TABS: 1.0000 mg | ORAL_TABLET | Freq: Two times a day (BID) | ORAL | Status: AC | PRN

## 2010-12-12 MED ORDER — LORAZEPAM 1 MG PO TABS: 1.0000 mg | ORAL_TABLET | Freq: Two times a day (BID) | ORAL | Status: DC | PRN

## 2010-12-12 NOTE — Assessment & Plan Note (Signed)
Worsened.  Will continue pain management, do PPD and order labs.  Asked pt to have Rheum send notes.

## 2010-12-12 NOTE — Assessment & Plan Note (Signed)
Chronic pain continues to plague her, beyond her joints.  She is on 60 of cymbalta daily.  Continue current dose.

## 2010-12-12 NOTE — Progress Notes (Signed)
  Subjective:    Patient ID: Carla Wilson, female    DOB: [birth year], 60 y.o.   MRN: [id number]  HPI  Carla Wilson comes in for followup of her rheumatoid arthritis. She says that her arthritis has been getting worse, and she is having more and more trouble using her hands. She says she has also had having increased deformity in her feet. She says her rheumatologist wants to try the new medication and she says that it will be a shot any headaches every few weeks. She says that she has to have her labs drawn and her TB skin test done before starting medication. She says it is cheaper for her to have it done here family practice tonight her rheumatologist.  She says that with her pain getting worse, she is having more difficulty dealing with her anxiety and her panic attacks. She says her right now 26 year old daughter and her 48 year old grandson are living with her, and while she is glad to have family around her, they do cause her a lot of worry and anxiety. She says that the Xanax helps fro her a few hours, but it seems like there her periods in between when she can take a dose that she used to shaking side she is so anxious. She is wondering if there is a medicine that would last for longer period of time.  Carla Wilson says that she is doing okay on the Vicodin. She has daily pain, but is worse in the morning and makes her to get out of bed. She says she also has pain in other places besides the sites of her RA. Sunday she says she just aches all over.  Review of Systems Pertinent items noted in history of present illness    Objective:   Physical Exam BP 115/76  Pulse 129  Temp(Src) 98.5 F (36.9 C) (Oral)  Ht 5\' 4"  (1.626 m)  Wt 184 lb (83.462 kg)  BMI 31.58 kg/m2 General appearance: alert, cooperative and no distress Eyes: conjunctivae/corneas clear. PERRL, EOM's intact. Fundi benign. Throat: lips, mucosa, and tongue normal; teeth and gums normal Neck: no adenopathy, supple, symmetrical, trachea  midline and thyroid not enlarged, symmetric, no tenderness/mass/nodules Lungs: clear to auscultation bilaterally Heart: regular rate and rhythm, S1, S2 normal, no murmur, click, rub or gallop Abdomen: soft, non-tender; bowel sounds normal; no masses,  no organomegaly Extremities: Pt with rhematoid changes of both hand and feet.  She is no longer able to straighten her hands back to normal.  Pulses: 2+ and symmetric       Assessment & Plan:

## 2010-12-12 NOTE — Patient Instructions (Signed)
It was good to see you.  Please make a nurse visit for Thursday morning to have your TB test read.  Please try the ativan for your anxiety instead of the xanax to see if you have better control with a longer acting medication.  Please come back and see me in about 3 months for follow up.    Please ask your Rheumatologist to send me notes from your office visit there.

## 2010-12-12 NOTE — Assessment & Plan Note (Signed)
Poorly controlled as her RA gets worse.  Will try changing to Ativan for a longer acting medication instead of xanax.  Discussed dangers of long acting medications- can add up, and discussed would not want her on both xanax and ativan together. Patient understands.

## 2010-12-15 ENCOUNTER — Ambulatory Visit (INDEPENDENT_AMBULATORY_CARE_PROVIDER_SITE_OTHER): Payer: Medicare Other | Admitting: *Deleted

## 2010-12-15 ENCOUNTER — Encounter: Payer: Self-pay | Admitting: Family Medicine

## 2010-12-15 DIAGNOSIS — IMO0001 Reserved for inherently not codable concepts without codable children: Secondary | ICD-10-CM

## 2010-12-15 DIAGNOSIS — Z111 Encounter for screening for respiratory tuberculosis: Secondary | ICD-10-CM

## 2010-12-15 LAB — TB SKIN TEST: TB Skin Test: NEGATIVE mm

## 2010-12-15 NOTE — Progress Notes (Signed)
Patient requests recent labs and PPD result be faxed to Dr. Corliss Skains. Faxed as requested. ROI signed  PPD negative. 0 mm induration. `

## 2010-12-17 ENCOUNTER — Other Ambulatory Visit: Payer: Self-pay | Admitting: Family Medicine

## 2010-12-19 ENCOUNTER — Encounter: Payer: Self-pay | Admitting: Family Medicine

## 2010-12-19 NOTE — Telephone Encounter (Signed)
Refill request

## 2010-12-29 ENCOUNTER — Telehealth: Payer: Self-pay | Admitting: Family Medicine

## 2010-12-29 DIAGNOSIS — F411 Generalized anxiety disorder: Secondary | ICD-10-CM

## 2010-12-29 NOTE — Telephone Encounter (Signed)
Carla Wilson calling to ask that she be put back on the Aprazalam 1 mg instead of being on the Lorazepam

## 2011-01-04 MED ORDER — ALPRAZOLAM 1 MG PO TABS: 1.0000 mg | ORAL_TABLET | Freq: Two times a day (BID) | ORAL | Status: DC | PRN

## 2011-01-04 NOTE — Telephone Encounter (Signed)
Called Carla Wilson about her medications.  She says she does not feel like the lorazepam helps as much as the alprazolam, and wants to switch back.  She is getting ready to start a new treatment for her Rheumatoid Arthritis, and is even more anxious about that than normal.   [facility]ed [facility], cancelled future Rx for Lorazepan, rx #60 Alprazolam, two months worth.

## 2011-02-06 ENCOUNTER — Telehealth: Payer: Self-pay | Admitting: Family Medicine

## 2011-02-06 NOTE — Telephone Encounter (Signed)
Spoke with pharmacy, patient filled fist rx on 11/15 and then filled second rx on 12/8, last rx says to fill 60 days from the first which would make patient due for refill on 02/11/11. Tried calling patient but keep getting nothing but static. Will forward to MD.

## 2011-02-06 NOTE — Telephone Encounter (Signed)
Tried calling patient again, keep getting nothing but static, will await callback from patient.

## 2011-02-06 NOTE — Telephone Encounter (Signed)
Pt states that she is out of her pain meds because [facility] - had lost the 2nd script and told her to come back on the 5th and she went back on the 5th and they are now stating that she can't get anything from them until the 12th.  She is in a great deal of pain with her RA and Dr Corliss Skains will not give her anything and she is very unhappy with her.  Wants to change to another RA doctor. pls call to let her know Wants to know if Dr Lula Olszewski can call something in for her today  [facility] [place]

## 2011-02-06 NOTE — Telephone Encounter (Signed)
Patient is not due for another refill until next week.  She will need to come in for early refill.  Thanks.   Carla Wilson 02/06/2011 10:52 AM

## 2011-02-14 ENCOUNTER — Ambulatory Visit (INDEPENDENT_AMBULATORY_CARE_PROVIDER_SITE_OTHER): Payer: Medicare Other | Admitting: Family Medicine

## 2011-02-14 ENCOUNTER — Encounter: Payer: Self-pay | Admitting: Family Medicine

## 2011-02-14 VITALS — BP 158/84 | HR 106 | Temp 97.7°F | Ht 64.0 in | Wt 182.2 lb

## 2011-02-14 DIAGNOSIS — F411 Generalized anxiety disorder: Secondary | ICD-10-CM

## 2011-02-14 DIAGNOSIS — G4700 Insomnia, unspecified: Secondary | ICD-10-CM

## 2011-02-14 DIAGNOSIS — M069 Rheumatoid arthritis, unspecified: Secondary | ICD-10-CM

## 2011-02-14 DIAGNOSIS — Z23 Encounter for immunization: Secondary | ICD-10-CM

## 2011-02-14 LAB — BASIC METABOLIC PANEL
BUN: 24 mg/dL — ABNORMAL HIGH (ref 6–23)
Chloride: 106 mEq/L (ref 96–112)
Creat: 0.98 mg/dL (ref 0.50–1.10)
Glucose, Bld: 89 mg/dL (ref 70–99)
Potassium: 4.3 mEq/L (ref 3.5–5.3)

## 2011-02-14 LAB — CBC
MCH: 31.4 pg (ref 26.0–34.0)
MCHC: 31.3 g/dL (ref 30.0–36.0)
MCV: 100.3 fL — ABNORMAL HIGH (ref 78.0–100.0)
Platelets: 325 10*3/uL (ref 150–400)
RDW: 17 % — ABNORMAL HIGH (ref 11.5–15.5)

## 2011-02-14 MED ORDER — HYDROCODONE-ACETAMINOPHEN 5-500 MG PO TABS: 1.0000 | ORAL_TABLET | Freq: Four times a day (QID) | ORAL | Status: DC | PRN

## 2011-02-14 MED ORDER — ZOLPIDEM TARTRATE 10 MG PO TABS: 10.0000 mg | ORAL_TABLET | Freq: Every evening | ORAL | Status: DC | PRN

## 2011-02-14 MED ORDER — ALPRAZOLAM 1 MG PO TABS: 1.0000 mg | ORAL_TABLET | Freq: Every evening | ORAL | Status: DC | PRN

## 2011-02-14 MED ORDER — ALPRAZOLAM 1 MG PO TABS: 1.0000 mg | ORAL_TABLET | Freq: Three times a day (TID) | ORAL | Status: DC | PRN

## 2011-02-14 NOTE — Patient Instructions (Signed)
I'm sorry you are still hurting so badly.  I will make a referral for you to see if we can get you into [facility].  The office will call you to notify you of an appointment.

## 2011-02-15 ENCOUNTER — Encounter: Payer: Self-pay | Admitting: Family Medicine

## 2011-02-15 NOTE — Assessment & Plan Note (Signed)
Will increase xanax to TID prn.  If pt can get RA under better control, psych symptoms may improve as well.

## 2011-02-15 NOTE — Assessment & Plan Note (Addendum)
Will draw labs for her Rhematologist- who follows her blood counts, electrolytes, kidney function while on imuran and prednisone. I am going to refer her to [facility] clinic per patient's request for a second opinion. I am increasing vicodin to 3-4x/ day.  Again discussed with Carla Wilson that I am hopeful she will not need as much narcotics if RA better controlled.

## 2011-02-15 NOTE — Progress Notes (Signed)
  Subjective:    Patient ID: Carla Wilson, female    DOB: Oct 03, 1950, 61 y.o.   MRN: [id number]  HPI  Carla Wilson comes in for follow up.  She has been in severe pain lately, and is having trouble with her Rhematologist, Dr. Thayer Jew.  She says that she was supposed to start on a new RA mediation, but there have been problems with Dr. Garnette Gunner office and the pharmacy getting the medication, so she has not started it yet.  She also feels like they are not helping her there and do not empathize with her.    This has made her depression and anxiety worse, which also seems to make her pain worse.  She feels tired all the time but does not sleep well.  She says that she did not even feel like seeing her grandchildren open presents on Christmas.  She says she wants to have good days to spend with her children and grandchildren.  She denies HI/SI.   Review of Systems Pertinent items in HPI.     Objective:   Physical Exam BP 158/84  Pulse 106  Temp(Src) 97.7 F (36.5 C) (Oral)  Ht 5\' 4"  (1.626 m)  Wt 182 lb 3.2 oz (82.645 kg)  BMI 31.27 kg/m2 General appearance: alert, cooperative and upset. Eyes: conjunctivae/corneas clear. PERRL, EOM's intact. Fundi benign. Neck: no adenopathy, supple, symmetrical, trachea midline and thyroid not enlarged, symmetric, no tenderness/mass/nodules Lungs: clear to auscultation bilaterally Heart: regular rate and rhythm, S1, S2 normal, no murmur, click, rub or gallop Abdomen: soft, non-tender; bowel sounds normal; no masses,  no organomegaly Extremities: Rheumatoid changes of both hands and feet with lateral angulation of fingers/toes. Pulses: 2+ and symmetric Neurologic: Grossly normal       Assessment & Plan:

## 2011-02-15 NOTE — Assessment & Plan Note (Signed)
Will refill ambien  

## 2011-02-16 ENCOUNTER — Telehealth: Payer: Self-pay | Admitting: Family Medicine

## 2011-02-16 NOTE — Telephone Encounter (Signed)
Carla Wilson is needing a refill on her Cymbalta and the medication for her Rheumoid Arthrits to [facility] [place] [facility].  Please give her a call if there is a problem.

## 2011-02-19 MED ORDER — DULOXETINE HCL 60 MG PO CPEP: 60.0000 mg | ORAL_CAPSULE | Freq: Every day | ORAL | Status: DC

## 2011-02-19 NOTE — Telephone Encounter (Signed)
Rx Sent  

## 2011-03-01 ENCOUNTER — Other Ambulatory Visit: Payer: Self-pay | Admitting: Family Medicine

## 2011-03-01 NOTE — Telephone Encounter (Signed)
Refill request

## 2011-03-02 ENCOUNTER — Other Ambulatory Visit: Payer: Self-pay | Admitting: Family Medicine

## 2011-03-02 NOTE — Telephone Encounter (Signed)
Refill request

## 2011-03-10 ENCOUNTER — Other Ambulatory Visit: Payer: Self-pay | Admitting: Family Medicine

## 2011-03-10 ENCOUNTER — Telehealth: Payer: Self-pay | Admitting: Family Medicine

## 2011-03-10 MED ORDER — ONDANSETRON HCL 4 MG PO TABS: 4.0000 mg | ORAL_TABLET | Freq: Four times a day (QID) | ORAL | Status: AC | PRN

## 2011-03-10 NOTE — Telephone Encounter (Signed)
Discussed with Dr. McDiarmid.  Prefer patient be seen for office visit, but will Rx Zofran.  Patient informed to go to ED or urgent care if sxs worsen. Patient verbalized understanding and will follow-up with her PCP at office visit on 03/13/11.  Gaylene Brooks, RN

## 2011-03-10 NOTE — Telephone Encounter (Signed)
Returned call to patient.  Has been having nausea and vomiting x 3 days.  Not able to keep fluids down.  States the room spins when she stands up and not sure if she has a "stomach bug."  Denies fever or diarrhea.  Unable to come in for appt today and wants to know if something can be called in for nausea.  PCP not in office today.  Will route to Dr. McDiarmid (preceptor) for advice and call patient back.  Gaylene Brooks, RN

## 2011-03-10 NOTE — Progress Notes (Signed)
Called in 12 tablets of Zofran 4 mg for nausea and vomiting.  Patient to go to urgent care if not improving or if condition worsens.  Appointment with Dr Deirdre Priest on Monday.

## 2011-03-10 NOTE — Telephone Encounter (Signed)
Patient is calling because she is wanting some meds for nausea.  She has an appt on Monday, and doesn't feel she can get out today do to the nausea.

## 2011-03-13 ENCOUNTER — Ambulatory Visit (INDEPENDENT_AMBULATORY_CARE_PROVIDER_SITE_OTHER): Payer: Medicare Other | Admitting: Family Medicine

## 2011-03-13 ENCOUNTER — Encounter: Payer: Self-pay | Admitting: Family Medicine

## 2011-03-13 DIAGNOSIS — J069 Acute upper respiratory infection, unspecified: Secondary | ICD-10-CM

## 2011-03-13 DIAGNOSIS — M069 Rheumatoid arthritis, unspecified: Secondary | ICD-10-CM

## 2011-03-13 DIAGNOSIS — F411 Generalized anxiety disorder: Secondary | ICD-10-CM

## 2011-03-13 NOTE — Patient Instructions (Signed)
I'm sorry you are not feeling good.  I think you should try taking an over the counter zyrtec or Claritin to help clear your sinuses.  I also think it would help to use a saline nasal spray.    Our office is working on your referral to Dr. Ines Bloomer office, that is here in [place].    Please ask the pain clinic to send me a note when you see them.

## 2011-03-13 NOTE — Assessment & Plan Note (Signed)
No fevers chills, cough, dyspnea, or other red flags.  Feel this is a viral illness, advised symptomatic treatment.

## 2011-03-13 NOTE — Assessment & Plan Note (Signed)
Patient states she has been off her xanax for two weeks, she is not having withdrawal symptoms.  Discussed again that I an no longer willing to prescribe any controlled substances.  She voices understanding.

## 2011-03-13 NOTE — Progress Notes (Signed)
  Subjective:    Patient ID: Carla Wilson, female    DOB: 1950-05-24, 61 y.o.   MRN: [id number]  HPI  Miah comes in for follow up.  She has not yet gotten the referral to a new Rheumatologist.  She has started a new medication called Cimzia for her RA through Dr. Thayer Jew.  She says she still would like a second opinion.  She does not feel like Dr. Thayer Jew and she have a good relationship.  She says she has felt a little under the weather as well as nauseated and dizzy from her new medication.  She has not yet noticed much difference in her pain.   The patient received a call from a pain center and has an appointment on Thursday.  She states she thought that was the referral I made (which I did not).  Discussed that her pharmacy has sent a fax with concerns about the number of providers and pharmacies she is using for vicodin and xanax.  She denies having vicodin prescriptions filled by any other office than mine.   The patient complains that she has had some nasal congestion for about a week.  She denies fevers or chills.  She denies cough, shortness of breath.    Review of Systems Pertinent items in HPI.     Objective:   Physical Exam BP 151/81  Pulse 121  Temp(Src) 97.7 F (36.5 C) (Oral)  Ht 5\' 4"  (1.626 m)  Wt 187 lb 12.8 oz (85.186 kg)  BMI 32.24 kg/m2 General appearance: alert, cooperative and no distress Head: Normocephalic, without obvious abnormality, atraumatic, sinuses nontender to percussion Eyes: conjunctivae/corneas clear. PERRL, EOM's intact. Fundi benign. Ears: normal TM's and external ear canals both ears Nose: mild congestion Throat: lips, mucosa, and tongue normal; teeth and gums normal Lungs: clear to auscultation bilaterally Heart: regular rate and rhythm, S1, S2 normal, no murmur, click, rub or gallop Extremities: Chronic RA changes of hands.        Assessment & Plan:
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
## 2011-03-17 ENCOUNTER — Other Ambulatory Visit: Payer: Medicare Other

## 2011-04-07 ENCOUNTER — Other Ambulatory Visit: Payer: Self-pay | Admitting: Family Medicine

## 2011-04-07 MED ORDER — ZOLPIDEM TARTRATE 10 MG PO TABS: 10.0000 mg | ORAL_TABLET | Freq: Every evening | ORAL | Status: DC | PRN

## 2011-04-11 ENCOUNTER — Other Ambulatory Visit: Payer: Self-pay | Admitting: Family Medicine

## 2011-04-11 NOTE — Telephone Encounter (Signed)
Refill request

## 2011-04-13 ENCOUNTER — Other Ambulatory Visit: Payer: Self-pay | Admitting: Family Medicine

## 2011-04-13 ENCOUNTER — Encounter: Payer: Self-pay | Admitting: Family Medicine

## 2011-04-13 ENCOUNTER — Ambulatory Visit (INDEPENDENT_AMBULATORY_CARE_PROVIDER_SITE_OTHER): Payer: Medicare Other | Admitting: Family Medicine

## 2011-04-13 VITALS — BP 135/84 | HR 103 | Temp 98.4°F | Ht 64.0 in

## 2011-04-13 DIAGNOSIS — T07XXXA Unspecified multiple injuries, initial encounter: Secondary | ICD-10-CM

## 2011-04-13 MED ORDER — IBUPROFEN-FAMOTIDINE 800-26.6 MG PO TABS: 1.0000 | ORAL_TABLET | Freq: Two times a day (BID) | ORAL | Status: DC

## 2011-04-13 MED ORDER — HYDROCODONE-ACETAMINOPHEN 5-500 MG PO TABS: 1.0000 | ORAL_TABLET | Freq: Three times a day (TID) | ORAL | Status: AC | PRN

## 2011-04-13 MED ORDER — MELOXICAM 15 MG PO TABS: 15.0000 mg | ORAL_TABLET | Freq: Every day | ORAL | Status: DC
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
## 2011-04-13 NOTE — Assessment & Plan Note (Signed)
Patient with definite objective signs of pain. We have stopped prescribing her chronic narcotic or benzos due to a history of multiple prescribers, multiple pharmacies. I checked her narcotic database record today. Her last fill of narcotic or benzos was 2/18. This does agree with her story that she was not given narcotics by the doctors at [facility]. I have given her 20 Vicodin. She is to followup in one week for recheck. I do not plan to refill her medicines at that time. In February patient picked up 120 Xanax as well as Vicodin and oxycodone. She thinks that she has some Xanax at home.

## 2011-04-19 ENCOUNTER — Telehealth: Payer: Self-pay | Admitting: Family Medicine

## 2011-04-19 MED ORDER — ONDANSETRON 4 MG PO TBDP: 4.0000 mg | ORAL_TABLET | Freq: Four times a day (QID) | ORAL | Status: AC | PRN
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
## 2011-04-26 ENCOUNTER — Ambulatory Visit: Payer: Medicare Other | Admitting: Family Medicine

## 2011-04-26 ENCOUNTER — Telehealth: Payer: Self-pay | Admitting: Family Medicine

## 2011-04-26 NOTE — Telephone Encounter (Signed)
Patient is calling because she is going to a Phsycologist through [employer] weekly.  The Phsycologist faxed something to Dr. Lula Olszewski about getting her back on Xanax.

## 2011-05-01 NOTE — Telephone Encounter (Signed)
I did not receive any faxes regarding this.  I will continue to look for it in my box.

## 2011-05-10 ENCOUNTER — Telehealth: Payer: Self-pay | Admitting: Family Medicine
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
## 2011-05-11 NOTE — Telephone Encounter (Signed)
I will forward to patient PCP

## 2011-05-12 NOTE — Telephone Encounter (Signed)
Called left message with Dr. Larence Penning.  Left her my pager number, asked her to page when she had a change.    Pleasant Bensinger 05/12/2011 9:49 AM
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
## 2011-06-21 ENCOUNTER — Other Ambulatory Visit: Payer: Self-pay | Admitting: Family Medicine

## 2011-06-22 ENCOUNTER — Other Ambulatory Visit: Payer: Self-pay | Admitting: Family Medicine

## 2011-06-28 ENCOUNTER — Other Ambulatory Visit: Payer: Self-pay | Admitting: Family Medicine

## 2011-09-18 ENCOUNTER — Emergency Department (HOSPITAL_COMMUNITY)
Admission: EM | Admit: 2011-09-18 | Discharge: 2011-09-19 | Disposition: A | Discharge status: Home / Self Care | Payer: Medicare Other | Attending: Emergency Medicine | Admitting: Emergency Medicine

## 2011-09-18 ENCOUNTER — Encounter (HOSPITAL_COMMUNITY): Payer: Self-pay

## 2011-09-18 DIAGNOSIS — M797 Fibromyalgia: Secondary | ICD-10-CM

## 2011-09-18 DIAGNOSIS — Z79899 Other long term (current) drug therapy: Secondary | ICD-10-CM | POA: Insufficient documentation

## 2011-09-18 DIAGNOSIS — R5381 Other malaise: Secondary | ICD-10-CM | POA: Insufficient documentation

## 2011-09-18 DIAGNOSIS — M069 Rheumatoid arthritis, unspecified: Secondary | ICD-10-CM | POA: Insufficient documentation

## 2011-09-18 DIAGNOSIS — IMO0001 Reserved for inherently not codable concepts without codable children: Secondary | ICD-10-CM | POA: Insufficient documentation

## 2011-09-18 LAB — CBC
HCT: 33.7 % — ABNORMAL LOW (ref 36.0–46.0)
Hemoglobin: 11 g/dL — ABNORMAL LOW (ref 12.0–15.0)
MCH: 31.3 pg (ref 26.0–34.0)
MCHC: 32.6 g/dL (ref 30.0–36.0)
MCV: 96 fL (ref 78.0–100.0)
Platelets: 282 10*3/uL (ref 150–400)
RBC: 3.51 MIL/uL — ABNORMAL LOW (ref 3.87–5.11)
RDW: 14.8 % (ref 11.5–15.5)
WBC: 6.9 10*3/uL (ref 4.0–10.5)

## 2011-09-18 LAB — URINALYSIS, ROUTINE W REFLEX MICROSCOPIC
Glucose, UA: NEGATIVE mg/dL
Hgb urine dipstick: NEGATIVE
Ketones, ur: 15 mg/dL — AB
Leukocytes, UA: NEGATIVE
Nitrite: NEGATIVE
Protein, ur: NEGATIVE mg/dL
Specific Gravity, Urine: 1.032 — ABNORMAL HIGH (ref 1.005–1.030)
Urobilinogen, UA: 1 mg/dL (ref 0.0–1.0)
pH: 5 (ref 5.0–8.0)

## 2011-09-18 LAB — BASIC METABOLIC PANEL
BUN: 17 mg/dL (ref 6–23)
CO2: 21 mEq/L (ref 19–32)
Calcium: 9.4 mg/dL (ref 8.4–10.5)
Chloride: 103 mEq/L (ref 96–112)
Creatinine, Ser: 0.63 mg/dL (ref 0.50–1.10)
GFR calc Af Amer: >90 mL/min (ref >90)
GFR calc non Af Amer: >90 mL/min (ref >90)
Glucose, Bld: 98 mg/dL (ref 70–99)
Potassium: 4.1 mEq/L (ref 3.5–5.1)
Sodium: 134 mEq/L — ABNORMAL LOW (ref 135–145)

## 2011-09-18 MED ORDER — DULOXETINE HCL 60 MG PO CPEP
60.0000 mg | ORAL_CAPSULE | Freq: Every day | ORAL | Status: DC
  Administered 2011-09-19: 60 mg via ORAL
  Filled 2011-09-18: qty 1

## 2011-09-18 MED ORDER — PREDNISONE 20 MG PO TABS: 40.0000 mg | ORAL_TABLET | Freq: Every day | ORAL | Status: AC

## 2011-09-18 MED ORDER — ALPRAZOLAM 0.5 MG PO TABS
0.5000 mg | ORAL_TABLET | Freq: Three times a day (TID) | ORAL | Status: DC | PRN
  Administered 2011-09-19: 0.5 mg via ORAL
  Filled 2011-09-18: qty 1

## 2011-09-18 MED ORDER — NEBIVOLOL HCL 5 MG PO TABS
5.0000 mg | ORAL_TABLET | Freq: Every day | ORAL | Status: DC
  Administered 2011-09-19: 5 mg via ORAL
  Filled 2011-09-18: qty 1

## 2011-09-18 MED ORDER — CYCLOBENZAPRINE HCL 10 MG PO TABS
10.0000 mg | ORAL_TABLET | Freq: Two times a day (BID) | ORAL | Status: DC | PRN
  Administered 2011-09-19 (×2): 10 mg via ORAL
  Filled 2011-09-18 (×2): qty 1

## 2011-09-18 MED ORDER — SODIUM CHLORIDE 0.9 % IV BOLUS (SEPSIS)
1000.0000 mL | Freq: Once | INTRAVENOUS | Status: AC
  Administered 2011-09-18: 1000 mL via INTRAVENOUS

## 2011-09-18 MED ORDER — HYDROMORPHONE HCL PF 1 MG/ML IJ SOLN
1.0000 mg | Freq: Once | INTRAMUSCULAR | Status: AC
  Administered 2011-09-18: 1 mg via INTRAMUSCULAR
  Filled 2011-09-18: qty 1

## 2011-09-18 MED ORDER — ESTRADIOL 0.05 MG/24HR TD PTWK: 0.0500 mg | MEDICATED_PATCH | TRANSDERMAL | Status: DC

## 2011-09-18 MED ORDER — ALUM & MAG HYDROXIDE-SIMETH 200-200-20 MG/5ML PO SUSP: 30.0000 mL | ORAL | Status: DC | PRN

## 2011-09-18 MED ORDER — OXYCODONE-ACETAMINOPHEN 5-325 MG PO TABS: 1.0000 | ORAL_TABLET | ORAL | Status: AC | PRN

## 2011-09-18 MED ORDER — DIAZEPAM 5 MG PO TABS
5.0000 mg | ORAL_TABLET | Freq: Once | ORAL | Status: AC
  Administered 2011-09-18: 5 mg via ORAL
  Filled 2011-09-18: qty 1

## 2011-09-18 MED ORDER — PREDNISONE 20 MG PO TABS
60.0000 mg | ORAL_TABLET | Freq: Once | ORAL | Status: AC
  Administered 2011-09-18: 60 mg via ORAL
  Filled 2011-09-18: qty 3

## 2011-09-18 MED ORDER — ONDANSETRON HCL 4 MG PO TABS: 4.0000 mg | ORAL_TABLET | Freq: Three times a day (TID) | ORAL | Status: DC | PRN

## 2011-09-18 MED ORDER — ONDANSETRON HCL 4 MG/2ML IJ SOLN
4.0000 mg | Freq: Once | INTRAMUSCULAR | Status: AC
  Administered 2011-09-18: 4 mg via INTRAVENOUS
  Filled 2011-09-18: qty 2

## 2011-09-18 MED ORDER — HYDROMORPHONE HCL PF 1 MG/ML IJ SOLN
1.0000 mg | Freq: Once | INTRAMUSCULAR | Status: AC
  Administered 2011-09-18: 1 mg via INTRAVENOUS
  Filled 2011-09-18: qty 1

## 2011-09-18 MED ORDER — ACETAMINOPHEN 325 MG PO TABS
650.0000 mg | ORAL_TABLET | ORAL | Status: DC | PRN
  Administered 2011-09-19: 650 mg via ORAL
  Filled 2011-09-18: qty 2

## 2011-09-18 MED ORDER — GABAPENTIN 300 MG PO CAPS
300.0000 mg | ORAL_CAPSULE | Freq: Every day | ORAL | Status: DC
  Administered 2011-09-19: 300 mg via ORAL
  Filled 2011-09-18: qty 1
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
## 2011-09-18 NOTE — ED Notes (Signed)
Pt states "I hurt all over, been peeing on myself 'cause I can't get up, right now I can feel my legs but when I go to stand on 'em, I can't move"; pt presents with deformities to hands/digits.

## 2011-09-18 NOTE — ED Notes (Signed)
Md at bedside

## 2011-09-18 NOTE — Progress Notes (Signed)
CSW was consulted by Charge RN to assist with pt and family as pt was up for discharge and pt and family refused to leave ED. CSW met with EDP and learned that the pt did not have a medical reason for admit, however may need assisted living placement as pt states she cannot walk/ care for herself and lives alone.   CSW met with the pt and her two sisters. CSW found that the pt lives with her adult daughter and grandson. Pt states she began having an "episode on Saturday that has worsened today and left her unable ambulate". Pt states that she has a Corporate treasurer. appt on the 28th and just needs to get properly medicated until then. Pt denies wanting/needing to go to an assisted living facility, stating that she can typically ambulate and her daughter cares for her, pt states she just needs to stay overnight to let the medication work so that she can ambulate in the morning. EDP notified and is in agreement with this disposition.   *Home Health Information: CSW reviewed home health services and provided a list of home health agencies for the pt to chose from. Pt prefers to use [facility]. Pt's PCP is Dr. Mauricio Po with [facility]. EDP has ordered home health RN, PT and OT. EDP to contact the on-call CM.   Pt agrees to d/c home on 09/19/11 with home health.    No further CSW needs identified.
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
## 2011-09-18 NOTE — ED Notes (Signed)
Pt states "I hurt from my head to my toes, I have rheumatoid arthritis, I can't walk, or stand. I don't know whats going on with me I hurt all over." reports symptoms x' 3 days. Also reports hx of fibromyalgia.

## 2011-09-19 NOTE — Progress Notes (Signed)
I stopped by to check on patient this morning and to further assess her needs for d/c. Patient and husband at bedside are not interested in pursuing placement at d/c (neither ALF or SNF). Patient is confident she will be able to go home at d/c- awaiting PT/OT at this time. RNCM to f/u for HH/DME needs- please call CSW if needs arise- Reece Levy, MSW, [employer] [phone]

## 2011-09-19 NOTE — Evaluation (Signed)
Physical Therapy Evaluation Patient Details Name: Carla Wilson MRN: [id number] DOB: 1951/01/09 Today's Date: 09/19/2011 Time: 4098-1191 PT Time Calculation (min): 21 min  PT Assessment / Plan / Recommendation Clinical Impression  pt will benefit from HHPT which has already been set up per chart    PT Assessment  All further PT needs can be met in the next venue of care    Follow Up Recommendations  Home health PT;Supervision/Assistance - 24 hour    Barriers to Discharge        Equipment Recommendations  None recommended by OT    Recommendations for Other Services     Frequency      Precautions / Restrictions Precautions Precautions: Fall Precaution Comments: recommend use of RW and assist with mobility at home initially until pt demo's increase safety--per HHPT   Pertinent Vitals/Pain       Mobility  Bed Mobility Bed Mobility: Sit to Supine;Supine to Sit Supine to Sit: 5: Supervision Sit to Supine: 5: Supervision Details for Bed Mobility Assistance: cues for task completion, increased time Transfers Transfers: Sit to Stand;Stand to Sit Sit to Stand: 4: Min guard;From bed Stand to Sit: 4: Min guard;To bed Details for Transfer Assistance: cues for hand placement, increased time Ambulation/Gait Ambulation/Gait Assistance: 4: Min guard Ambulation Distance (Feet): 50 Feet Assistive device: Rolling walker Ambulation/Gait Assistance Details: cues for RW distance from self Gait Pattern: Step-to pattern;Step-through pattern;Trunk flexed;Decreased hip/knee flexion - left;Decreased hip/knee flexion - right Gait velocity: slow General Gait Details: amb with bil knees in ~20degrees of flexion    Exercises     PT Diagnosis:    PT Problem List: Decreased strength;Decreased range of motion;Decreased activity tolerance;Decreased balance;Decreased knowledge of use of DME PT Treatment Interventions:     PT Goals    Visit Information  Last PT Received On:  09/19/11 Assistance Needed: +1 PT/OT Co-Evaluation/Treatment: Yes    Subjective Data  Subjective: I am sore Patient Stated Goal: none   Prior Functioning  Home Living Lives With: Significant other Available Help at Discharge: Available 24 hours/day Type of Home: House Home Access: Stairs to enter [employer] of Steps: 1-2 Entrance Stairs-Rails: None Home Layout: One level Bathroom Shower/Tub: Walk-in shower;Tub only Bathroom Toilet: Standard Home Adaptive Equipment: Bedside commode/3-in-1;Walker - rolling;Walker - standard;Wheelchair - Research scientist (medical) Comments: pt is going home with her boyfriend; normally resides with her dtr and grandson Prior Function Level of Independence: Independent Driving: No Vocation: On disability Comments: pt and boyfriend report pt I but slow/struggled at times; non-amb x 3days prior to adm Communication Communication: No difficulties Dominant Hand: Right    Cognition  Overall Cognitive Status: Appears within functional limits for tasks assessed/performed Arousal/Alertness: Awake/alert Orientation Level: Appears intact for tasks assessed Behavior During Session: [facility] for tasks performed    Extremity/Trunk Assessment Right Upper Extremity Assessment RUE ROM/Strength/Tone: Deficits RUE ROM/Strength/Tone Deficits: Pt with RA deformities throughout UB Left Upper Extremity Assessment LUE ROM/Strength/Tone: Deficits LUE ROM/Strength/Tone Deficits: Pt with RA deformities throughout UB Right Lower Extremity Assessment RLE ROM/Strength/Tone: [facility] for tasks assessed;Deficits RLE ROM/Strength/Tone Deficits: limited knee extension, pt reports more difficulty since being off her RA meds Left Lower Extremity Assessment LLE ROM/Strength/Tone: [facility] for tasks assessed;Deficits LLE ROM/Strength/Tone Deficits: same as above   Balance    End of Session PT - End of Session Equipment Utilized During Treatment: Gait belt Activity  Tolerance: Patient tolerated treatment well Patient left: in bed;with call bell/phone within reach;with family/visitor present Nurse Communication: Mobility status  GP  [place] 09/19/2011, 10:42 AM

## 2011-09-19 NOTE — ED Notes (Signed)
Pt in bed resting with family at bedside. Will continue to monitor.

## 2011-09-19 NOTE — ED Notes (Signed)
Pt family member displeased with discharge due to pt having difficulty moving and still in pain. Informed family member plan of care and rationale. Pt family member asked to speak with physician. Physician informed. Physician verbally prescribed pain medication and informed me he will speak with the family.

## 2011-09-19 NOTE — ED Notes (Signed)
Pt verbalized understanding of discharge instructions. Pt assessment has not changed from am.

## 2011-09-19 NOTE — ED Provider Notes (Signed)
Pt was seen by case manager.   Arrangements made for her to go home.  Her husband is here to pick her up.  Cheri Guppy, MD 09/19/11 1120

## 2011-09-19 NOTE — Progress Notes (Signed)
[facility] ED CM contacted to assist with home health services for pt d/c.  Pt and [facility] chose [facility] for services Will be staying temporarily at [address] [place] [address] [phone] CM updated EPIC.  CM spoke with D[facility] Pikes at [facility] to [facility] for [facility], PT/OT Pt states she has a w/c, bedside commode, crutches.  Husband will driving pt home EDP, [place], updated.

## 2011-09-19 NOTE — ED Notes (Signed)
Physician at bedside.

## 2011-09-19 NOTE — Evaluation (Signed)
Occupational Therapy Evaluation Patient Details Name: Carla Wilson MRN: [id number] DOB: 12/22/1950 Today's Date: 09/19/2011 Time: 4098-1191 OT Time Calculation (min): 12 min  OT Assessment / Plan / Recommendation Clinical Impression  Pt is a 61 yo female who presents with a hx of RA and fibromyalgia. Pt appears to be near baseline with all ADLs and will have prn A at home from fiancee. D/c planned for today.    OT Assessment  All further OT needs can be met in the next venue of care    Follow Up Recommendations  Home health OT;Supervision/Assistance - 24 hour    Barriers to Discharge      Equipment Recommendations  None recommended by OT    Recommendations for Other Services    Frequency       Precautions / Restrictions Precautions Precautions: Fall Precaution Comments: recommend use of RW and assist with mobility at home initially until pt demo's increase safety--per HHPT   Pertinent Vitals/Pain     ADL  Grooming: Simulated;Set up Where Assessed - Grooming: Unsupported sitting Upper Body Bathing: Set up Where Assessed - Upper Body Bathing: Unsupported sitting Lower Body Bathing: Minimal assistance Where Assessed - Lower Body Bathing: Supported sit to stand Upper Body Dressing: Simulated;Set up Where Assessed - Upper Body Dressing: Unsupported sitting Lower Body Dressing: Simulated;Minimal assistance Where Assessed - Lower Body Dressing: Supported sit to stand Toilet Transfer: Automotive engineer Method: Sit to stand Toileting - Architect and Hygiene: Simulated;Min guard Equipment Used: Rolling walker;Gait belt    OT Diagnosis: Generalized weakness  OT Problem List: Decreased activity tolerance;Decreased strength;Decreased range of motion;Impaired UE functional use;Pain OT Treatment Interventions:     OT Goals    Visit Information  Last OT Received On: 09/19/11 Assistance Needed: +1 PT/OT Co-Evaluation/Treatment: Yes    Subjective Data  Subjective: It's scary when you wake up and think your going to fall on your face. Patient Stated Goal: Not asked   Prior Functioning  Vision/Perception  Home Living Lives With: Significant other Available Help at Discharge: Available 24 hours/day Type of Home: House Home Access: Stairs to enter [employer] of Steps: 1-2 Entrance Stairs-Rails: None Home Layout: One level Bathroom Shower/Tub: Walk-in shower;Tub only Bathroom Toilet: Standard Home Adaptive Equipment: Bedside commode/3-in-1;Walker - rolling;Walker - standard;Wheelchair - Research scientist (medical) Comments: pt is going home with her boyfriend; normally resides with her dtr and grandson Prior Function Level of Independence: Independent Driving: No Vocation: On disability Comments: pt and boyfriend report pt I but slow/struggled at times; non-amb x 3days prior to adm Communication Communication: No difficulties Dominant Hand: Right      Cognition  Overall Cognitive Status: Appears within functional limits for tasks assessed/performed Arousal/Alertness: Awake/alert Orientation Level: Appears intact for tasks assessed Behavior During Session: [facility] for tasks performed    Extremity/Trunk Assessment Right Upper Extremity Assessment RUE ROM/Strength/Tone: Deficits RUE ROM/Strength/Tone Deficits: Pt with RA deformities throughout UB Left Upper Extremity Assessment LUE ROM/Strength/Tone: Deficits LUE ROM/Strength/Tone Deficits: Pt with RA deformities throughout UB Right Lower Extremity Assessment RLE ROM/Strength/Tone: [facility] for tasks assessed;Deficits RLE ROM/Strength/Tone Deficits: limited knee extension, pt reports more difficulty since being off her RA meds Left Lower Extremity Assessment LLE ROM/Strength/Tone: [facility] for tasks assessed;Deficits LLE ROM/Strength/Tone Deficits: same as above   Mobility Bed Mobility Bed Mobility: Sit to Supine;Supine to Sit Supine to Sit: 5: Supervision Sit to Supine:  5: Supervision Details for Bed Mobility Assistance: cues for task completion, increased time Transfers Sit to Stand: 4: Min guard;From bed  Stand to Sit: 4: Min guard;To bed Details for Transfer Assistance: cues for hand placement, increased time   Exercise    Balance    End of Session OT - End of Session Equipment Utilized During Treatment: Gait belt Activity Tolerance: Patient tolerated treatment well Patient left: in bed;with call bell/phone within reach;with family/visitor present  GO Functional Assessment Tool Used: Clinical Judgement. Functional Limitation: Self care Self Care Current Status [phone]): At least 1 percent but less than 20 percent impaired, limited or restricted Self Care Goal Status ([id number]): At least 1 percent but less than 20 percent impaired, limited or restricted Self Care Discharge Status [phone]): At least 1 percent but less than 20 percent impaired, limited or restricted   Shaunn Tackitt A OTR/L [phone] 09/19/2011, 10:43 AM

## 2011-09-20 NOTE — Progress Notes (Signed)
09/19/11 1022  PT G-Codes **NOT FOR INPATIENT CLASS**  Functional Assessment Tool Used clinical judgement  Functional Limitation Mobility: Walking and moving around  Mobility: Walking and Moving Around Current Status [phone]) CI  Mobility: Walking and Moving Around Goal Status [phone]) CI  Mobility: Walking and Moving Around Discharge Status ([id number]) CI  PT General Charges  $$ ACUTE PT VISIT 1 Procedure  PT Evaluation  $Initial PT Evaluation Tier I 1 Procedure   late entry for G-codes

## 2014-02-09 DIAGNOSIS — M059 Rheumatoid arthritis with rheumatoid factor, unspecified: Secondary | ICD-10-CM | POA: Diagnosis not present

## 2014-04-06 DIAGNOSIS — A881 Epidemic vertigo: Secondary | ICD-10-CM | POA: Diagnosis not present

## 2014-04-06 DIAGNOSIS — Z79899 Other long term (current) drug therapy: Secondary | ICD-10-CM | POA: Diagnosis not present

## 2014-04-06 DIAGNOSIS — Z131 Encounter for screening for diabetes mellitus: Secondary | ICD-10-CM | POA: Diagnosis not present

## 2014-04-06 DIAGNOSIS — E780 Pure hypercholesterolemia: Secondary | ICD-10-CM | POA: Diagnosis not present

## 2014-04-06 DIAGNOSIS — R5383 Other fatigue: Secondary | ICD-10-CM | POA: Diagnosis not present

## 2014-05-05 DIAGNOSIS — M059 Rheumatoid arthritis with rheumatoid factor, unspecified: Secondary | ICD-10-CM | POA: Diagnosis not present

## 2014-05-05 DIAGNOSIS — I10 Essential (primary) hypertension: Secondary | ICD-10-CM | POA: Diagnosis not present

## 2014-06-08 DIAGNOSIS — M059 Rheumatoid arthritis with rheumatoid factor, unspecified: Secondary | ICD-10-CM | POA: Diagnosis not present

## 2016-05-03 ENCOUNTER — Other Ambulatory Visit: Payer: Self-pay | Admitting: Physician Assistant

## 2016-05-03 DIAGNOSIS — M8580 Other specified disorders of bone density and structure, unspecified site: Secondary | ICD-10-CM

## 2016-11-03 ENCOUNTER — Inpatient Hospital Stay
Admission: EM | Admit: 2016-11-03 | Discharge: 2016-11-05 | DRG: 871 | Disposition: A | Discharge status: Home Health | Payer: Medicare Other | Attending: Internal Medicine | Admitting: Internal Medicine

## 2016-11-03 ENCOUNTER — Emergency Department: Payer: Medicare Other

## 2016-11-03 ENCOUNTER — Encounter: Payer: Self-pay | Admitting: *Deleted

## 2016-11-03 DIAGNOSIS — F32A Depression, unspecified: Secondary | ICD-10-CM | POA: Diagnosis present

## 2016-11-03 DIAGNOSIS — Z888 Allergy status to other drugs, medicaments and biological substances status: Secondary | ICD-10-CM

## 2016-11-03 DIAGNOSIS — Z79899 Other long term (current) drug therapy: Secondary | ICD-10-CM | POA: Diagnosis not present

## 2016-11-03 DIAGNOSIS — M797 Fibromyalgia: Secondary | ICD-10-CM | POA: Diagnosis present

## 2016-11-03 DIAGNOSIS — I214 Non-ST elevation (NSTEMI) myocardial infarction: Secondary | ICD-10-CM | POA: Diagnosis present

## 2016-11-03 DIAGNOSIS — M069 Rheumatoid arthritis, unspecified: Secondary | ICD-10-CM | POA: Diagnosis present

## 2016-11-03 DIAGNOSIS — Z880 Allergy status to penicillin: Secondary | ICD-10-CM | POA: Diagnosis not present

## 2016-11-03 DIAGNOSIS — Z882 Allergy status to sulfonamides status: Secondary | ICD-10-CM

## 2016-11-03 DIAGNOSIS — A419 Sepsis, unspecified organism: Principal | ICD-10-CM | POA: Diagnosis present

## 2016-11-03 DIAGNOSIS — I38 Endocarditis, valve unspecified: Secondary | ICD-10-CM | POA: Diagnosis present

## 2016-11-03 DIAGNOSIS — J189 Pneumonia, unspecified organism: Secondary | ICD-10-CM | POA: Diagnosis present

## 2016-11-03 DIAGNOSIS — F329 Major depressive disorder, single episode, unspecified: Secondary | ICD-10-CM | POA: Diagnosis present

## 2016-11-03 DIAGNOSIS — R7989 Other specified abnormal findings of blood chemistry: Secondary | ICD-10-CM

## 2016-11-03 DIAGNOSIS — R748 Abnormal levels of other serum enzymes: Secondary | ICD-10-CM | POA: Diagnosis present

## 2016-11-03 DIAGNOSIS — R778 Other specified abnormalities of plasma proteins: Secondary | ICD-10-CM

## 2016-11-03 DIAGNOSIS — F419 Anxiety disorder, unspecified: Secondary | ICD-10-CM | POA: Diagnosis present

## 2016-11-03 DIAGNOSIS — E876 Hypokalemia: Secondary | ICD-10-CM | POA: Diagnosis present

## 2016-11-03 DIAGNOSIS — Z886 Allergy status to analgesic agent status: Secondary | ICD-10-CM | POA: Diagnosis not present

## 2016-11-03 HISTORY — DX: Systemic involvement of connective tissue, unspecified: M35.9

## 2016-11-03 LAB — BASIC METABOLIC PANEL
ANION GAP: 12 (ref 5–15)
BUN: 14 mg/dL (ref 6–20)
CALCIUM: 9 mg/dL (ref 8.9–10.3)
CO2: 25 mmol/L (ref 22–32)
CREATININE: 0.5 mg/dL (ref 0.44–1.00)
Chloride: 96 mmol/L — ABNORMAL LOW (ref 101–111)
GLUCOSE: 119 mg/dL — AB (ref 65–99)
Potassium: 2.8 mmol/L — ABNORMAL LOW (ref 3.5–5.1)
Sodium: 133 mmol/L — ABNORMAL LOW (ref 135–145)

## 2016-11-03 LAB — CBC
HCT: 34.4 % — ABNORMAL LOW (ref 35.0–47.0)
Hemoglobin: 11.5 g/dL — ABNORMAL LOW (ref 12.0–16.0)
MCH: 28.1 pg (ref 26.0–34.0)
MCHC: 33.3 g/dL (ref 32.0–36.0)
MCV: 84.5 fL (ref 80.0–100.0)
PLATELETS: 282 10*3/uL (ref 150–440)
RBC: 4.07 MIL/uL (ref 3.80–5.20)
RDW: 17.1 % — ABNORMAL HIGH (ref 11.5–14.5)
WBC: 6.7 10*3/uL (ref 3.6–11.0)

## 2016-11-03 LAB — DIFFERENTIAL
BASOS ABS: 0.1 10*3/uL (ref 0–0.1)
Basophils Relative: 1 %
EOS PCT: 1 %
Eosinophils Absolute: 0.1 10*3/uL (ref 0–0.7)
LYMPHS ABS: 0.7 10*3/uL — AB (ref 1.0–3.6)
LYMPHS PCT: 11 %
Monocytes Absolute: 0.6 10*3/uL (ref 0.2–0.9)
Monocytes Relative: 9 %
NEUTROS PCT: 78 %
Neutro Abs: 5.3 10*3/uL (ref 1.4–6.5)

## 2016-11-03 LAB — LACTIC ACID, PLASMA: LACTIC ACID, VENOUS: 1.1 mmol/L (ref 0.5–1.9)

## 2016-11-03 LAB — TROPONIN I
TROPONIN I: 0.19 ng/mL — AB (ref <0.03)
Troponin I: 0.13 ng/mL (ref <0.03)

## 2016-11-03 MED ORDER — SODIUM CHLORIDE 0.9 % IV BOLUS (SEPSIS)
1000.0000 mL | Freq: Once | INTRAVENOUS | Status: AC
  Administered 2016-11-03: 1000 mL via INTRAVENOUS

## 2016-11-03 MED ORDER — METHYLPREDNISOLONE SODIUM SUCC 125 MG IJ SOLR
125.0000 mg | Freq: Once | INTRAMUSCULAR | Status: AC
  Administered 2016-11-03: 125 mg via INTRAVENOUS
  Filled 2016-11-03: qty 2

## 2016-11-03 MED ORDER — BENZONATATE 100 MG PO CAPS: 200.0000 mg | ORAL_CAPSULE | Freq: Three times a day (TID) | ORAL | Status: DC | PRN

## 2016-11-03 MED ORDER — IPRATROPIUM-ALBUTEROL 0.5-2.5 (3) MG/3ML IN SOLN
9.0000 mL | Freq: Once | RESPIRATORY_TRACT | Status: AC
  Administered 2016-11-03: 9 mL via RESPIRATORY_TRACT
  Filled 2016-11-03: qty 9

## 2016-11-03 MED ORDER — FENTANYL CITRATE (PF) 100 MCG/2ML IJ SOLN
25.0000 ug | Freq: Once | INTRAMUSCULAR | Status: AC
  Administered 2016-11-03: 25 ug via INTRAVENOUS
  Filled 2016-11-03: qty 2

## 2016-11-03 MED ORDER — AZTREONAM 2 G IJ SOLR
2.0000 g | Freq: Once | INTRAMUSCULAR | Status: AC
  Administered 2016-11-04: 2 g via INTRAVENOUS
  Filled 2016-11-03: qty 2

## 2016-11-03 MED ORDER — IOPAMIDOL (ISOVUE-370) INJECTION 76%
75.0000 mL | Freq: Once | INTRAVENOUS | Status: AC | PRN
  Administered 2016-11-03: 75 mL via INTRAVENOUS

## 2016-11-03 MED ORDER — MORPHINE SULFATE (PF) 2 MG/ML IV SOLN: 2.0000 mg | Freq: Once | INTRAVENOUS | Status: DC

## 2016-11-03 MED ORDER — GUAIFENESIN-DM 100-10 MG/5ML PO SYRP: 5.0000 mL | ORAL_SOLUTION | ORAL | Status: DC | PRN

## 2016-11-03 MED ORDER — VANCOMYCIN HCL IN DEXTROSE 1-5 GM/200ML-% IV SOLN
1000.0000 mg | Freq: Once | INTRAVENOUS | Status: AC
  Administered 2016-11-04: 1000 mg via INTRAVENOUS
  Filled 2016-11-03: qty 200

## 2016-11-03 MED ORDER — MORPHINE SULFATE (PF) 2 MG/ML IV SOLN: 2.0000 mg | INTRAVENOUS | Status: DC | PRN

## 2016-11-03 MED ORDER — ALPRAZOLAM 0.25 MG PO TABS
0.2500 mg | ORAL_TABLET | Freq: Once | ORAL | Status: DC
  Filled 2016-11-03: qty 1

## 2016-11-03 MED ORDER — ONDANSETRON HCL 4 MG/2ML IJ SOLN
4.0000 mg | Freq: Once | INTRAMUSCULAR | Status: AC
  Administered 2016-11-03: 4 mg via INTRAVENOUS
  Filled 2016-11-03: qty 2

## 2016-11-03 NOTE — ED Notes (Addendum)
Pt c/o chest pain with non-productive cough. Pt states "it feels like forever" when asked how long she has been coughing. Pt denies fever. Pt sisters with pt. Sisters state she went to her doctor recently and was told bronchitis, started on antibiotics and given a shot at doctors." Pt states she feels like when she had pneumonia. States weakness x 3 weeks. Hx rheumatoid arthritis. States RA has affected lungs before.

## 2016-11-03 NOTE — ED Triage Notes (Signed)
Pt complains of chest pain, generalized weakness for 3 weeks week, pt has had a weight loss of greater than 25 pounds in, pt pale reports no appetite

## 2016-11-03 NOTE — ED Triage Notes (Signed)
First Nurse Note:  Arrives with C/O several day history of cough, SOB  Has history of RA.  In NAD

## 2016-11-03 NOTE — ED Provider Notes (Signed)
[facility] Emergency Department Provider Note  ____________________________________________   First MD Initiated Contact with Patient 11/03/16 2026     (approximate)  I have reviewed the triage vital signs and the nursing notes.   HISTORY  Chief Complaint Chest Pain   HPI Carla Wilson is a 66 y.o. female with a history of fibromyalgia, pulmonary disease and rheumatoid arthritis is present. Emergency department with 3 weeks of nonproductive cough and now with body aches. She says that she is also having chest pain especially with deep breathing and coughing upon exhalation. She says that her pain is "everywhere." Says it feels like an aching type pain. She takes immunosuppressants for her rheumatoid arthritis. No history of blood clots. No known sick contacts.   Past Medical History:  Diagnosis Date  . Anxiety   . Collagen vascular disease (HCC)   . Depression   . Fibromyalgia   . Pulmonary disease    NOS  . RA (rheumatoid arthritis) [facility])     Patient Active Problem List   Diagnosis Date Noted  . Depression 11/03/2016  . CAP (community acquired pneumonia) 11/03/2016  . NSTEMI (non-ST elevated myocardial infarction) (HCC) 11/03/2016  . Multiple contusions 04/13/2011  . URI (upper respiratory infection) 03/13/2011  . LEG PAIN, RIGHT 11/03/2009  . NONTRAUMATIC RUPTURE OF UNSPECIFIED TENDON 09/21/2009  . PULMONARY DISEASE 06/17/2009  . HYPOTHYROIDISM 06/01/2009  . Anxiety 06/01/2009  . PANIC ATTACK 06/01/2009  . MIGRAINE HEADACHE 06/01/2009  . Rheumatoid arthritis (HCC) 06/01/2009  . FIBROMYALGIA 06/01/2009  . INSOMNIA 06/01/2009  . CHRONIC FATIGUE SYNDROME 06/01/2009    Past Surgical History:  Procedure Laterality Date  . ABDOMINAL HYSTERECTOMY    . CESAREAN SECTION      Prior to Admission medications   Medication Sig Start Date End Date Taking? Authorizing Provider  albuterol (PROVENTIL HFA;VENTOLIN HFA) 108 (90 Base) MCG/ACT  inhaler Inhale 1-2 puffs into the lungs every 6 (six) hours as needed for wheezing or shortness of breath.   Yes [provider]  ALPRAZolam Prudy Feeler) 1 MG tablet Take 1 mg by mouth 4 (four) times daily as needed for anxiety.   Yes [provider]  citalopram (CELEXA) 40 MG tablet Take 40 mg by mouth daily.   Yes [provider]  DULoxetine (CYMBALTA) 60 MG capsule Take 1 capsule (60 mg total) by mouth daily. 02/19/11  Yes [name], MD  estradiol (CLIMARA - DOSED IN MG/24 HR) 0.05 mg/24hr patch Place 1 patch onto the skin once a week.   Yes [provider]  etanercept (ENBREL) 25 MG injection Inject into the skin once a week.   Yes [provider]  levofloxacin (LEVAQUIN) 250 MG tablet Take 250 mg by mouth daily. 10/30/16 11/06/16 Yes [provider]  oxyCODONE (ROXICODONE) 15 MG immediate release tablet Take 15 mg by mouth 4 (four) times daily.   Yes [provider]  traMADol (ULTRAM) 50 MG tablet Take 50 mg by mouth 2 (two) times daily.   Yes [provider]  zolpidem (AMBIEN) 10 MG tablet Take 10 mg by mouth at bedtime as needed for sleep.   Yes [provider]    Allergies Aspirin; Meperidine hcl; Penicillins; and Sulfa antibiotics  Family History  Problem Relation Age of Onset  . Cancer Mother   . Arthritis Father   . Cancer Father   . Cancer Sister   . Heart disease Maternal Grandfather   . Stroke Paternal Grandfather     Social History  Social History  Substance Use Topics  . Smoking status: Never Smoker  . Smokeless tobacco: Never Used  . Alcohol use No    Review of Systems  Constitutional: No fever/chills Eyes: No visual changes. ENT: No sore throat. Cardiovascular: as above Respiratory: as above Gastrointestinal: No abdominal pain.  No nausea, no vomiting.  No diarrhea.  No constipation. Genitourinary: Negative for dysuria. Musculoskeletal: Negative for back pain. Skin: Negative  for rash. Neurological: Negative for headaches, focal weakness or numbness.   ____________________________________________   PHYSICAL EXAM:  VITAL SIGNS: ED Triage Vitals [11/03/16 1819]  Enc Vitals Group     BP (!) 169/88     Pulse Rate 98     Resp 20     Temp 98.3 F (36.8 C)     Temp Source Oral     SpO2 97 %     Weight 110 lb (49.9 kg)     Height 5\' 4"  (1.626 m)     Head Circumference      Peak Flow      Pain Score      Pain Loc      Pain Edu?      Excl. in GC?     Constitutional: Alert and oriented. Well appearing and in no acute distress. Eyes: Conjunctivae are normal.  Head: Atraumatic. Nose: No congestion/rhinnorhea. Mouth/Throat: Mucous membranes are moist.  Neck: No stridor.   Cardiovascular: Normal rate, regular rhythm. Grossly normal heart sounds.  Respiratory: Normal respiratory effort.  No retractions. diffuse wheezing worse in the left upper field. X-ray cough. No respiratory distress. Gastrointestinal: Soft and nontender. No distention.  Musculoskeletal: No lower extremity tenderness nor edema.  No joint effusions.a lateral swollen mCP joints consistent withrheumatoid arthritis. Neurologic:  Normal speech and language. No gross focal neurologic deficits are appreciated. Skin:  Skin is warm, dry and intact. No rash noted. Psychiatric: Mood and affect are normal. Speech and behavior are normal.  ____________________________________________   LABS (all labs ordered are listed, but only abnormal results are displayed)  Labs Reviewed  BASIC METABOLIC PANEL - Abnormal; Notable for the following:       Result Value   Sodium 133 (*)    Potassium 2.8 (*)    Chloride 96 (*)    Glucose, Bld 119 (*)    All other components within normal limits  CBC - Abnormal; Notable for the following:    Hemoglobin 11.5 (*)    HCT 34.4 (*)    RDW 17.1 (*)    All other components within normal limits  TROPONIN I - Abnormal; Notable for the following:    Troponin I  0.19 (*)    All other components within normal limits  CULTURE, BLOOD (ROUTINE X 2)  CULTURE, BLOOD (ROUTINE X 2)  DIFFERENTIAL  TROPONIN I  LACTIC ACID, PLASMA  LACTIC ACID, PLASMA   ____________________________________________  EKG  ED ECG REPORT I, Arelia Longest, the attending physician, personally viewed and interpreted this ECG.   Date: 11/03/2016  EKG Time: 1815  Rate: 101  Rhythm: sinus tachycardia  Axis: normal  Intervals:none  ST&T Change: no ST segment elevation or depression. T-wave inversions in V2 through V4 which are new from previous.  ____________________________________________  RADIOLOGY  left upper lobe airspace disease consistent with pneumonia.  CT without PE. However, does show bilateral upper airspace disease consistent with possible pneumonia. ____________________________________________   PROCEDURES  Procedure(s) performed:   Procedures  Critical Care performed:   ____________________________________________   INITIAL IMPRESSION /  ASSESSMENT AND PLAN / ED COURSE  Pertinent labs & imaging results that were available during my care of the patient were reviewed by me and considered in my medical decision making (see chart for details).  DDX: Pneumonia, pleurisy, COPD exacerbation. Bronchitis, PE  Previous charts reviewed. Patient says that lung disease with a side effect from the medication. Possible interstitial fibrosis.    ----------------------------------------- 11:14 PM on 11/03/2016 -----------------------------------------  Patient says that her breathing is improved but still with x-ray cough and wheezing throughout. Patient to be placed on the hospital-acquired antibiotic protocol secondary to immunosuppressive status and also course of outpatient antibiotics in the last 2 weeks. We will give morphine for body aches. Discussed case with Dr. Anne Hahn of the hospitalist service who would like to place patient on heparin for  the elevated troponin as well as chest pain. I feel this is appropriate given clinical scenario. Patient and family were diagnosis and treatment plan. They're understanding of plan and willing to comply.  ____________________________________________   FINAL CLINICAL IMPRESSION(S) / ED DIAGNOSES  elevated troponin. Multifocal pneumonia.    NEW MEDICATIONS STARTED DURING THIS VISIT:  New Prescriptions   No medications on file     Note:  This document was prepared using Dragon voice recognition software and may include unintentional dictation errors.     Myrna Blazer, MD 11/03/16 [phone]

## 2016-11-03 NOTE — ED Notes (Signed)
This RN called lab to ask about if they had blood sent from triage. Per Lab, they are running tests now.

## 2016-11-03 NOTE — H&P (Signed)
[facility] - [place] at [facility]   PATIENT NAME: Carla Wilson    MR#:  [id number]  DATE OF BIRTH:  02-02-1950  DATE OF ADMISSION:  11/03/2016  PRIMARY CARE PHYSICIAN: System, Pcp Not In   REQUESTING/REFERRING PHYSICIAN: Pershing Proud, MD  CHIEF COMPLAINT:   Chief Complaint  Patient presents with  . Chest Pain    HISTORY OF PRESENT ILLNESS:  Carla Wilson  is a 66 y.o. female who presents with Several days of progressive shortness of breath, as well as chest pain for the past 24 hours. Patient states that she felt like she was becoming ill, but was waiting to see if she would feel better. Today she was found to have multifocal pneumonia on imaging. She was also found to have an elevated troponin and some new T-wave inversions on her EKG. She has no prior history of coronary artery disease. Hospitalists were called for admission  PAST MEDICAL HISTORY:   Past Medical History:  Diagnosis Date  . Anxiety   . Collagen vascular disease (HCC)   . Depression   . Fibromyalgia   . Pulmonary disease    NOS  . RA (rheumatoid arthritis) (HCC)     PAST SURGICAL HISTORY:   Past Surgical History:  Procedure Laterality Date  . ABDOMINAL HYSTERECTOMY    . CESAREAN SECTION      SOCIAL HISTORY:   Social History  Substance Use Topics  . Smoking status: Never Smoker  . Smokeless tobacco: Never Used  . Alcohol use No    FAMILY HISTORY:   Family History  Problem Relation Age of Onset  . Cancer Mother   . Arthritis Father   . Cancer Father   . Cancer Sister   . Heart disease Maternal Grandfather   . Stroke Paternal Grandfather     DRUG ALLERGIES:   Allergies  Allergen Reactions  . Aspirin Other (See Comments)    Upset stomach  . Meperidine Hcl Nausea And Vomiting  . Penicillins Swelling    Has patient had a PCN reaction causing immediate rash, facial/tongue/throat swelling, SOB or lightheadedness with hypotension: Yes Has patient had a PCN  reaction causing severe rash involving mucus membranes or skin necrosis: No Has patient had a PCN reaction that required hospitalization: No Has patient had a PCN reaction occurring within the last 10 years: No If all of the above answers are "NO", then may proceed with Cephalosporin use.  . Sulfa Antibiotics Nausea Only    MEDICATIONS AT HOME:   Prior to Admission medications   Medication Sig Start Date End Date Taking? Authorizing Provider  albuterol (PROVENTIL HFA;VENTOLIN HFA) 108 (90 Base) MCG/ACT inhaler Inhale 1-2 puffs into the lungs every 6 (six) hours as needed for wheezing or shortness of breath.   Yes [provider]  ALPRAZolam Prudy Feeler) 1 MG tablet Take 1 mg by mouth 4 (four) times daily as needed for anxiety.   Yes [provider]  citalopram (CELEXA) 40 MG tablet Take 40 mg by mouth daily.   Yes [provider]  DULoxetine (CYMBALTA) 60 MG capsule Take 1 capsule (60 mg total) by mouth daily. 02/19/11  Yes [name], MD  estradiol (CLIMARA - DOSED IN MG/24 HR) 0.05 mg/24hr patch Place 1 patch onto the skin once a week.   Yes [provider]  etanercept (ENBREL) 25 MG injection Inject into the skin once a week.   Yes [provider]  levofloxacin (LEVAQUIN) 250 MG tablet Take 250 mg by mouth  daily. 10/30/16 11/06/16 Yes [provider]  oxyCODONE (ROXICODONE) 15 MG immediate release tablet Take 15 mg by mouth 4 (four) times daily.   Yes [provider]  traMADol (ULTRAM) 50 MG tablet Take 50 mg by mouth 2 (two) times daily.   Yes [provider]  zolpidem (AMBIEN) 10 MG tablet Take 10 mg by mouth at bedtime as needed for sleep.   Yes [provider]    REVIEW OF SYSTEMS:  Review of Systems  Constitutional: Negative for chills, fever, malaise/fatigue and weight loss.  HENT: Negative for ear pain, hearing loss and tinnitus.   Eyes: Negative for blurred vision, double vision, pain and redness.   Respiratory: Positive for cough and shortness of breath. Negative for hemoptysis.   Cardiovascular: Positive for chest pain. Negative for palpitations, orthopnea and leg swelling.  Gastrointestinal: Negative for abdominal pain, constipation, diarrhea, nausea and vomiting.  Genitourinary: Negative for dysuria, frequency and hematuria.  Musculoskeletal: Negative for back pain, joint pain and neck pain.  Skin:       No acne, rash, or lesions  Neurological: Negative for dizziness, tremors, focal weakness and weakness.  Endo/Heme/Allergies: Negative for polydipsia. Does not bruise/bleed easily.  Psychiatric/Behavioral: Negative for depression. The patient is not nervous/anxious and does not have insomnia.      VITAL SIGNS:   Vitals:   11/03/16 1819 11/03/16 2001 11/03/16 2015 11/03/16 2245  BP: (!) 169/88 (!) 180/121 (!) 173/95 (!) 179/97  Pulse: 98 (!) 101 99 (!) 106  Resp: 20 (!) 24 18 (!) 23  Temp: 98.3 F (36.8 C)     TempSrc: Oral     SpO2: 97% 100% 98% 97%  Weight: 49.9 kg (110 lb)     Height: 5\' 4"  (1.626 m)      Wt Readings from Last 3 Encounters:  11/03/16 49.9 kg (110 lb)  03/13/11 85.2 kg (187 lb 12.8 oz)  02/14/11 82.6 kg (182 lb 3.2 oz)    PHYSICAL EXAMINATION:  Physical Exam  Vitals reviewed. Constitutional: She is oriented to person, place, and time. She appears well-developed and well-nourished. No distress.  HENT:  Head: Normocephalic and atraumatic.  Mouth/Throat: Oropharynx is clear and moist.  Eyes: Pupils are equal, round, and reactive to light. Conjunctivae and EOM are normal. No scleral icterus.  Neck: Normal range of motion. Neck supple. No JVD present. No thyromegaly present.  Cardiovascular: Normal rate, regular rhythm and intact distal pulses.  Exam reveals no gallop and no friction rub.   No murmur heard. Respiratory: Effort normal. No respiratory distress. She has no wheezes. She has no rales.  Bilateral coarse breath sounds  GI: Soft. Bowel  sounds are normal. She exhibits no distension. There is no tenderness.  Musculoskeletal: Normal range of motion. She exhibits no edema.  No arthritis, no gout  Lymphadenopathy:    She has no cervical adenopathy.  Neurological: She is alert and oriented to person, place, and time. No cranial nerve deficit.  No dysarthria, no aphasia  Skin: Skin is warm and dry. No rash noted. No erythema.  Psychiatric: She has a normal mood and affect. Her behavior is normal. Judgment and thought content normal.    LABORATORY PANEL:   CBC  Recent Labs Lab 11/03/16 1821  WBC 6.7  HGB 11.5*  HCT 34.4*  PLT 282   ------------------------------------------------------------------------------------------------------------------  Chemistries   Recent Labs Lab 11/03/16 1821  NA 133*  K 2.8*  CL 96*  CO2 25  GLUCOSE 119*  BUN 14  CREATININE 0.50  CALCIUM 9.0   ------------------------------------------------------------------------------------------------------------------  Cardiac Enzymes  Recent Labs Lab 11/03/16 1821  TROPONINI 0.19*   ------------------------------------------------------------------------------------------------------------------  RADIOLOGY:  Dg Chest 2 View  Result Date: 11/03/2016 CLINICAL DATA:  Cough and shortness of breath. EXAM: CHEST  2 VIEW COMPARISON:  Two-view chest x-ray 04/07/2011 FINDINGS: Heart size is normal. The lungs are hyperinflated. Asymmetric left upper lobe airspace disease is present. There is no cavitation. The right lung is clear. No significant effusions are present. IMPRESSION: 1. Left upper lobe airspace disease consistent with pneumonia. 2. Stable hyperinflation. 3. Scoliosis. Electronically Signed   By: Marin Roberts M.D.   On: 11/03/2016 18:41   Ct Angio Chest Pe W And/or Wo Contrast  Result Date: 11/03/2016 CLINICAL DATA:  Chest pain, generalized weakness for 3 weeks, weight loss of greater than 25 pounds, pale, anorexia,  rheumatoid arthritis, fibromyalgia, high pretest probability of pulmonary embolism EXAM: CT ANGIOGRAPHY CHEST WITH CONTRAST TECHNIQUE: Multidetector CT imaging of the chest was performed using the standard protocol during bolus administration of intravenous contrast. Multiplanar CT image reconstructions and MIPs were obtained to evaluate the vascular anatomy. CONTRAST:  75 cc Isovue 370 IV COMPARISON:  04/08/2009, 03/01/2006 FINDINGS: Cardiovascular: Atherosclerotic calcifications aorta without aneurysm or dissection. Pulmonary arteries adequately opacified and patent. No evidence of pulmonary embolism. No pericardial effusion. Mediastinum/Nodes: RIGHT thyroid mass 26 x 18 mm image 5, increased since 2008 with a smaller less well-defined nodule at the inferior pole of the RIGHT lobe also noted. Scattered normal sized mediastinal, hilar, and LEFT axillary nodes. Esophagus unremarkable. Lungs/Pleura: Scattered peribronchial thickening. Progressive scattered ground-glass opacities in BILATERAL upper lobes which could be due to infection, bronchiolitis, or potentially rheumatoid lung disease. No pleural effusion or pneumothorax. No discrete pulmonary nodule/mass. Upper Abdomen: Unremarkable Musculoskeletal: No acute osseous findings. Review of the MIP images confirms the above findings. IMPRESSION: No evidence of pulmonary embolism. Progressive ground-glass opacities bilaterally with upper lobe predominance which could be due to bronchiolitis, rheumatoid lung disease, or infection. RIGHT thyroid nodules largest 26 x 18 mm increased since 2008; followup nonemergent thyroid sonography recommended. Aortic Atherosclerosis (ICD10-I70.0). Electronically Signed   By: Ulyses Southward M.D.   On: 11/03/2016 22:08    EKG:   Orders placed or performed during the hospital encounter of 11/03/16  . EKG 12-Lead  . EKG 12-Lead  . ED EKG within 10 minutes  . ED EKG within 10 minutes    IMPRESSION AND PLAN:  Principal Problem:    NSTEMI (non-ST elevated myocardial infarction) (HCC) - patient's troponin was elevated, she's been having symptoms and she had some new EKG changes. Heparin drip started. We will trend her enzymes tonight, get an echocardiogram and a cardiology consult Active Problems:   CAP (community acquired pneumonia) - IV antibiotics in place, when necessary duo nebs   Rheumatoid arthritis (HCC) - IV Solu-Medrol for the possibility of some rheumatoid inflammation contributing to respiratory condition   Anxiety - continue home meds   Depression - home medications  All the records are reviewed and case discussed with ED provider. Management plans discussed with the patient and/or family.  DVT PROPHYLAXIS: Systemic anticoagulation  GI PROPHYLAXIS: None  ADMISSION STATUS: Inpatient  CODE STATUS: Full Code Status History    Date Active Date Inactive Code Status Order ID Comments User Context   09/18/2011 11:50 PM 09/19/2011  4:33 PM Full Code [id number]  Raeford Razor, MD ED      TOTAL TIME TAKING CARE OF THIS PATIENT: 45 minutes.  Briah Nary FIELDING 11/03/2016, 11:10 PM  [employer]  [phone]  CC: Primary care physician; System, Pcp Not In  Note:  This document was prepared using Dragon voice recognition software and may include unintentional dictation errors.

## 2016-11-03 NOTE — ED Notes (Addendum)
[phone] Lupita Leash- daugther   Hilbert Corrigan [phone]- daughter    Daughters took pt clothes home. Pt only has cellphone with her.

## 2016-11-03 NOTE — ED Notes (Signed)
Ambulated pt to toilet and back with no distress noted.

## 2016-11-04 ENCOUNTER — Inpatient Hospital Stay
Admit: 2016-11-04 | Discharge: 2016-11-04 | Disposition: A | Discharge status: Home / Self Care | Payer: Medicare Other | Attending: Internal Medicine | Admitting: Internal Medicine

## 2016-11-04 LAB — CBC
HEMATOCRIT: 29.1 % — AB (ref 35.0–47.0)
Hemoglobin: 9.9 g/dL — ABNORMAL LOW (ref 12.0–16.0)
MCH: 27.8 pg (ref 26.0–34.0)
MCHC: 34 g/dL (ref 32.0–36.0)
MCV: 81.8 fL (ref 80.0–100.0)
Platelets: 244 10*3/uL (ref 150–440)
RBC: 3.56 MIL/uL — ABNORMAL LOW (ref 3.80–5.20)
RDW: 16.7 % — AB (ref 11.5–14.5)
WBC: 3.4 10*3/uL — ABNORMAL LOW (ref 3.6–11.0)

## 2016-11-04 LAB — BASIC METABOLIC PANEL
Anion gap: 9 (ref 5–15)
BUN: 12 mg/dL (ref 6–20)
CALCIUM: 8 mg/dL — AB (ref 8.9–10.3)
CO2: 22 mmol/L (ref 22–32)
Chloride: 100 mmol/L — ABNORMAL LOW (ref 101–111)
Creatinine, Ser: 0.65 mg/dL (ref 0.44–1.00)
GFR calc Af Amer: >60 mL/min (ref >60)
GLUCOSE: 211 mg/dL — AB (ref 65–99)
POTASSIUM: 3.2 mmol/L — AB (ref 3.5–5.1)
Sodium: 131 mmol/L — ABNORMAL LOW (ref 135–145)

## 2016-11-04 LAB — TROPONIN I
TROPONIN I: 0.13 ng/mL — AB (ref <0.03)
TROPONIN I: 0.09 ng/mL — AB (ref <0.03)
TROPONIN I: 0.09 ng/mL — AB (ref <0.03)

## 2016-11-04 LAB — PROTIME-INR
INR: 1.17
PROTHROMBIN TIME: 14.8 s (ref 11.4–15.2)

## 2016-11-04 LAB — APTT: aPTT: 33 seconds (ref 24–36)

## 2016-11-04 LAB — ECHOCARDIOGRAM COMPLETE
HEIGHTINCHES: 64 in
Weight: 1817.6 oz

## 2016-11-04 LAB — LACTIC ACID, PLASMA: LACTIC ACID, VENOUS: 0.9 mmol/L (ref 0.5–1.9)

## 2016-11-04 LAB — HEPARIN LEVEL (UNFRACTIONATED): Heparin Unfractionated: <0.1 IU/mL — ABNORMAL LOW (ref 0.30–0.70)

## 2016-11-04 MED ORDER — HEPARIN (PORCINE) IN NACL 100-0.45 UNIT/ML-% IJ SOLN
600.0000 [IU]/h | INTRAMUSCULAR | Status: DC
  Administered 2016-11-04: 600 [IU]/h via INTRAVENOUS
  Filled 2016-11-04: qty 250

## 2016-11-04 MED ORDER — CITALOPRAM HYDROBROMIDE 20 MG PO TABS
40.0000 mg | ORAL_TABLET | Freq: Every day | ORAL | Status: DC
  Administered 2016-11-04 – 2016-11-05 (×2): 40 mg via ORAL
  Filled 2016-11-04 (×2): qty 2

## 2016-11-04 MED ORDER — ONDANSETRON HCL 4 MG/2ML IJ SOLN: 4.0000 mg | Freq: Four times a day (QID) | INTRAMUSCULAR | Status: DC | PRN

## 2016-11-04 MED ORDER — ENOXAPARIN SODIUM 40 MG/0.4ML ~~LOC~~ SOLN
40.0000 mg | SUBCUTANEOUS | Status: DC
  Administered 2016-11-05: 40 mg via SUBCUTANEOUS
  Filled 2016-11-04: qty 0.4

## 2016-11-04 MED ORDER — ALPRAZOLAM 1 MG PO TABS
1.0000 mg | ORAL_TABLET | Freq: Four times a day (QID) | ORAL | Status: DC | PRN
  Administered 2016-11-04 – 2016-11-05 (×5): 1 mg via ORAL
  Filled 2016-11-04 (×5): qty 1

## 2016-11-04 MED ORDER — METHYLPREDNISOLONE SODIUM SUCC 125 MG IJ SOLR
60.0000 mg | Freq: Four times a day (QID) | INTRAMUSCULAR | Status: DC
  Administered 2016-11-04 (×2): 60 mg via INTRAVENOUS
  Filled 2016-11-04 (×2): qty 2

## 2016-11-04 MED ORDER — IPRATROPIUM-ALBUTEROL 0.5-2.5 (3) MG/3ML IN SOLN: 3.0000 mL | RESPIRATORY_TRACT | Status: DC | PRN

## 2016-11-04 MED ORDER — METHYLPREDNISOLONE SODIUM SUCC 40 MG IJ SOLR
40.0000 mg | Freq: Every day | INTRAMUSCULAR | Status: DC
  Administered 2016-11-05: 40 mg via INTRAVENOUS
  Filled 2016-11-04: qty 1

## 2016-11-04 MED ORDER — ZOLPIDEM TARTRATE 5 MG PO TABS: 5.0000 mg | ORAL_TABLET | Freq: Every evening | ORAL | Status: DC | PRN

## 2016-11-04 MED ORDER — ACETAMINOPHEN 325 MG PO TABS
650.0000 mg | ORAL_TABLET | Freq: Four times a day (QID) | ORAL | Status: DC | PRN
  Administered 2016-11-04 – 2016-11-05 (×3): 650 mg via ORAL
  Filled 2016-11-04 (×3): qty 2

## 2016-11-04 MED ORDER — VANCOMYCIN HCL IN DEXTROSE 750-5 MG/150ML-% IV SOLN
750.0000 mg | INTRAVENOUS | Status: DC
  Administered 2016-11-04 – 2016-11-05 (×2): 750 mg via INTRAVENOUS
  Filled 2016-11-04 (×3): qty 150

## 2016-11-04 MED ORDER — POTASSIUM CHLORIDE CRYS ER 20 MEQ PO TBCR
40.0000 meq | EXTENDED_RELEASE_TABLET | Freq: Once | ORAL | Status: AC
  Administered 2016-11-04: 40 meq via ORAL
  Filled 2016-11-04: qty 2

## 2016-11-04 MED ORDER — ACETAMINOPHEN 650 MG RE SUPP: 650.0000 mg | Freq: Four times a day (QID) | RECTAL | Status: DC | PRN

## 2016-11-04 MED ORDER — DULOXETINE HCL 30 MG PO CPEP
60.0000 mg | ORAL_CAPSULE | Freq: Every day | ORAL | Status: DC
  Administered 2016-11-04 – 2016-11-05 (×2): 60 mg via ORAL
  Filled 2016-11-04 (×3): qty 2

## 2016-11-04 MED ORDER — HEPARIN BOLUS VIA INFUSION
3100.0000 [IU] | Freq: Once | INTRAVENOUS | Status: AC
  Administered 2016-11-04: 3100 [IU] via INTRAVENOUS
  Filled 2016-11-04: qty 3100

## 2016-11-04 MED ORDER — OXYCODONE HCL 5 MG PO TABS
15.0000 mg | ORAL_TABLET | Freq: Four times a day (QID) | ORAL | Status: DC
  Administered 2016-11-04 – 2016-11-05 (×6): 15 mg via ORAL
  Filled 2016-11-04 (×6): qty 3

## 2016-11-04 MED ORDER — ONDANSETRON HCL 4 MG PO TABS: 4.0000 mg | ORAL_TABLET | Freq: Four times a day (QID) | ORAL | Status: DC | PRN

## 2016-11-04 MED ORDER — OXYCODONE HCL 5 MG PO TABS: 5.0000 mg | ORAL_TABLET | ORAL | Status: DC | PRN

## 2016-11-04 MED ORDER — DEXTROSE 5 % IV SOLN
2.0000 g | Freq: Three times a day (TID) | INTRAVENOUS | Status: DC
  Administered 2016-11-04 – 2016-11-05 (×4): 2 g via INTRAVENOUS
  Filled 2016-11-04 (×7): qty 2

## 2016-11-04 NOTE — Progress Notes (Signed)
Pharmacist - Prescriber Communication  Enoxaparin dose modified from 30 mg subcutaneously once daily to 40 mg subcutaneously once daily due to ABW > 45 kg and CrCl > 30 mL/min.   Jasmyne Lodato [place]. [place], [place].D., BCPS Clinical Pharmacist 11/04/2016 11:13

## 2016-11-04 NOTE — Progress Notes (Signed)
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

## 2016-11-04 NOTE — Progress Notes (Signed)
*  PRELIMINARY RESULTS* Echocardiogram 2D Echocardiogram has been performed.  Garrel Ridgel Jaser Fullen 11/04/2016, 9:06 AM
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
## 2016-11-04 NOTE — Progress Notes (Signed)
[facility] - [place] at [facility]   PATIENT NAME: Carla Wilson    MR#:  [id number]  DATE OF BIRTH:  May 01, 1950  SUBJECTIVE:  CHIEF COMPLAINT:   Chief Complaint  Patient presents with  . Chest Pain   - came in with dyspnea and chest pain. - improving today, feels exhausted though  REVIEW OF SYSTEMS:  Review of Systems  Constitutional: Positive for chills and malaise/fatigue. Negative for fever.  HENT: Negative for ear discharge, ear pain, hearing loss and nosebleeds.   Eyes: Negative for blurred vision and double vision.  Respiratory: Negative for cough, shortness of breath and wheezing.   Cardiovascular: Negative for chest pain, palpitations and leg swelling.  Gastrointestinal: Negative for abdominal pain, constipation, diarrhea, nausea and vomiting.  Genitourinary: Negative for dysuria and urgency.  Musculoskeletal: Positive for back pain and myalgias.  Neurological: Negative for dizziness, sensory change, speech change, focal weakness, seizures and headaches.  Psychiatric/Behavioral: Negative for depression.    DRUG ALLERGIES:   Allergies  Allergen Reactions  . Aspirin Other (See Comments)    Upset stomach  . Meperidine Hcl Nausea And Vomiting  . Penicillins Swelling    Has patient had a PCN reaction causing immediate rash, facial/tongue/throat swelling, SOB or lightheadedness with hypotension: Yes Has patient had a PCN reaction causing severe rash involving mucus membranes or skin necrosis: No Has patient had a PCN reaction that required hospitalization: No Has patient had a PCN reaction occurring within the last 10 years: No If all of the above answers are "NO", then may proceed with Cephalosporin use.  . Sulfa Antibiotics Nausea Only    VITALS:  Blood pressure (!) 157/81, pulse 82, temperature 97.9 F (36.6 C), temperature source Oral, resp. rate 18, height 5\' 4"  (1.626 m), weight 51.5 kg (113 lb 9.6 oz), SpO2 95 %.  PHYSICAL EXAMINATION:    Physical Exam  GENERAL:  66 y.o.-year-old very thin patient lying in the bed with no acute distress.  EYES: Pupils equal, round, reactive to light and accommodation. No scleral icterus. Extraocular muscles intact.  HEENT: Head atraumatic, normocephalic. Oropharynx and nasopharynx clear.  NECK:  Supple, no jugular venous distention. No thyroid enlargement, no tenderness.  LUNGS: Normal breath sounds bilaterally, no wheezing, rales,rhonchi or crepitation. No use of accessory muscles of respiration. Decreased bibasilar breath sounds. CARDIOVASCULAR: S1, S2 normal. No   rubs, or gallops. 3/6 systolic murmur present ABDOMEN: Soft, nontender, nondistended. Bowel sounds present. No organomegaly or mass.  EXTREMITIES: No pedal edema, cyanosis, or clubbing. Arthritis changes of hands NEUROLOGIC: Cranial nerves II through XII are intact. Muscle strength 5/5 in all extremities. Sensation intact. Gait not checked.  PSYCHIATRIC: The patient is alert and oriented x 3.  SKIN: No obvious rash, lesion, or ulcer.    LABORATORY PANEL:   CBC  Recent Labs Lab 11/04/16 0215  WBC 3.4*  HGB 9.9*  HCT 29.1*  PLT 244   ------------------------------------------------------------------------------------------------------------------  Chemistries   Recent Labs Lab 11/04/16 0215  NA 131*  K 3.2*  CL 100*  CO2 22  GLUCOSE 211*  BUN 12  CREATININE 0.65  CALCIUM 8.0*   ------------------------------------------------------------------------------------------------------------------  Cardiac Enzymes  Recent Labs Lab 11/04/16 0747  TROPONINI 0.09*   ------------------------------------------------------------------------------------------------------------------  RADIOLOGY:  Dg Chest 2 View  Result Date: 11/03/2016 CLINICAL DATA:  Cough and shortness of breath. EXAM: CHEST  2 VIEW COMPARISON:  Two-view chest x-ray 04/07/2011 FINDINGS: Heart size is normal. The lungs are hyperinflated.  Asymmetric left upper lobe airspace disease is  present. There is no cavitation. The right lung is clear. No significant effusions are present. IMPRESSION: 1. Left upper lobe airspace disease consistent with pneumonia. 2. Stable hyperinflation. 3. Scoliosis. Electronically Signed   By: Marin Roberts M.D.   On: 11/03/2016 18:41   Ct Angio Chest Pe W And/or Wo Contrast  Result Date: 11/03/2016 CLINICAL DATA:  Chest pain, generalized weakness for 3 weeks, weight loss of greater than 25 pounds, pale, anorexia, rheumatoid arthritis, fibromyalgia, high pretest probability of pulmonary embolism EXAM: CT ANGIOGRAPHY CHEST WITH CONTRAST TECHNIQUE: Multidetector CT imaging of the chest was performed using the standard protocol during bolus administration of intravenous contrast. Multiplanar CT image reconstructions and MIPs were obtained to evaluate the vascular anatomy. CONTRAST:  75 cc Isovue 370 IV COMPARISON:  04/08/2009, 03/01/2006 FINDINGS: Cardiovascular: Atherosclerotic calcifications aorta without aneurysm or dissection. Pulmonary arteries adequately opacified and patent. No evidence of pulmonary embolism. No pericardial effusion. Mediastinum/Nodes: RIGHT thyroid mass 26 x 18 mm image 5, increased since 2008 with a smaller less well-defined nodule at the inferior pole of the RIGHT lobe also noted. Scattered normal sized mediastinal, hilar, and LEFT axillary nodes. Esophagus unremarkable. Lungs/Pleura: Scattered peribronchial thickening. Progressive scattered ground-glass opacities in BILATERAL upper lobes which could be due to infection, bronchiolitis, or potentially rheumatoid lung disease. No pleural effusion or pneumothorax. No discrete pulmonary nodule/mass. Upper Abdomen: Unremarkable Musculoskeletal: No acute osseous findings. Review of the MIP images confirms the above findings. IMPRESSION: No evidence of pulmonary embolism. Progressive ground-glass opacities bilaterally with upper lobe predominance  which could be due to bronchiolitis, rheumatoid lung disease, or infection. RIGHT thyroid nodules largest 26 x 18 mm increased since 2008; followup nonemergent thyroid sonography recommended. Aortic Atherosclerosis (ICD10-I70.0). Electronically Signed   By: Ulyses Southward M.D.   On: 11/03/2016 22:08    EKG:   Orders placed or performed during the hospital encounter of 11/03/16  . EKG 12-Lead  . EKG 12-Lead    ASSESSMENT AND PLAN:   66 year old female with past medical history significant for rheumatoid arthritis, fibromyalgia and collagenous vascular disease and depression comes to hospital secondary to several days history of shortness of breath, chest pain and also loss of appetite  #1 Sepsis- secondary to multifocal pneumonia - awaiting blood cultures, on vancomycin and aztreonam due to allergies - Decrease the dose of steroids. -On room air currently. Continue cough medications. -Echocardiogram showing possible tricuspid valve vegetations.  #2 elevated troponin-likely demand ischemia from underlying sepsis. -Appreciate cardiology consult. Outpatient stress test if needed. -Echocardiogram with no wall motion abnormalities. -Discontinue heparin drip  #3 fibromyalgia and chronic pain from arthritis-continue home medications  #4 depression-on Celexa and Cymbalta  #5 hypokalemia-being replaced  #6 DVT prophylaxis-changed to Lovenox  Physical therapy consult   All the records are reviewed and case discussed with Care Management/Social Workerr. Management plans discussed with the patient, family and they are in agreement.  CODE STATUS: Full Code  TOTAL TIME TAKING CARE OF THIS PATIENT: 38 minutes.   POSSIBLE D/C IN 1-2 DAYS, DEPENDING ON CLINICAL CONDITION.   Khanh Cordner M.D on 11/04/2016 at 11:06 AM  Between 7am to 6pm - Pager - [phone]  After 6pm go to [email or web address] - password [employer]  [facility]  Office  [phone]  CC: Primary care  physician; System, Pcp Not In

## 2016-11-04 NOTE — Progress Notes (Signed)
Pharmacy Antibiotic Note  Carla Wilson is a 66 y.o. female admitted on 11/03/2016 with pneumonia.  Pharmacy has been consulted for vancomycin and aztreonam dosing.  Plan: DW 50kg  Vd 35L kei 0.05 hr-1  T1/2 14 hours Vancomycin 750 mg q 18 hours ordered with stacked dosing. Level before 5th dose. Goal trough 15-20.  Aztreonam 2 grams q 8 hours ordered.  Height: 5\' 4"  (162.6 cm) Weight: 113 lb 9.6 oz (51.5 kg) IBW/kg (Calculated) : 54.7  Temp (24hrs), Avg:98.3 F (36.8 C), Min:98.2 F (36.8 C), Max:98.3 F (36.8 C)   Recent Labs Lab 11/03/16 1821 11/03/16 2302  WBC 6.7  --   CREATININE 0.50  --   LATICACIDVEN  --  1.1    Estimated Creatinine Clearance: 57 mL/min (by C-G formula based on SCr of 0.5 mg/dL).    Allergies  Allergen Reactions  . Aspirin Other (See Comments)    Upset stomach  . Meperidine Hcl Nausea And Vomiting  . Penicillins Swelling    Has patient had a PCN reaction causing immediate rash, facial/tongue/throat swelling, SOB or lightheadedness with hypotension: Yes Has patient had a PCN reaction causing severe rash involving mucus membranes or skin necrosis: No Has patient had a PCN reaction that required hospitalization: No Has patient had a PCN reaction occurring within the last 10 years: No If all of the above answers are "NO", then may proceed with Cephalosporin use.  . Sulfa Antibiotics Nausea Only    Antimicrobials this admission: Aztreonam, vancomycin 10/6  >>    >>   Dose adjustments this admission:   Microbiology results: 10/5 BCx: pending      10/5 CXR:  L upper lobe airspace disease Thank you for allowing pharmacy to be a part of this patient's care.  Jona Erkkila S 11/04/2016 1:41 AM

## 2016-11-04 NOTE — Progress Notes (Signed)
ANTICOAGULATION CONSULT NOTE - Initial Consult  Pharmacy Consult for heparin drip Indication: chest pain/ACS  Allergies  Allergen Reactions  . Aspirin Other (See Comments)    Upset stomach  . Meperidine Hcl Nausea And Vomiting  . Penicillins Swelling    Has patient had a PCN reaction causing immediate rash, facial/tongue/throat swelling, SOB or lightheadedness with hypotension: Yes Has patient had a PCN reaction causing severe rash involving mucus membranes or skin necrosis: No Has patient had a PCN reaction that required hospitalization: No Has patient had a PCN reaction occurring within the last 10 years: No If all of the above answers are "NO", then may proceed with Cephalosporin use.  . Sulfa Antibiotics Nausea Only    Patient Measurements: Height: 5\' 4"  (162.6 cm) Weight: 113 lb 9.6 oz (51.5 kg) IBW/kg (Calculated) : 54.7 Heparin Dosing Weight: 52 kg  Vital Signs: Temp: 97.6 F (36.4 C) (10/06 0354) Temp Source: Oral (10/06 0354) BP: 167/94 (10/06 0354) Pulse Rate: 96 (10/06 0354)  Labs:  Recent Labs  11/03/16 1821 11/03/16 2302 11/04/16 0215  HGB 11.5*  --  9.9*  HCT 34.4*  --  29.1*  PLT 282  --  244  APTT  --   --  33  LABPROT  --   --  14.8  INR  --   --  1.17  CREATININE 0.50  --  0.65  TROPONINI 0.19* 0.13* 0.13*    Estimated Creatinine Clearance: 57 mL/min (by C-G formula based on SCr of 0.65 mg/dL).   Medical History: Past Medical History:  Diagnosis Date  . Anxiety   . Collagen vascular disease (HCC)   . Depression   . Fibromyalgia   . Pulmonary disease    NOS  . RA (rheumatoid arthritis) (HCC)     Medications:  No anticoagulation in PTA meds  Assessment:   Goal of Therapy:  Heparin level 0.3-0.7 units/ml Monitor platelets by anticoagulation protocol: Yes   Plan:  3100 unit bolus and initial rate of 600 units/hr. First heparin level 6 hours after start of infusion.  Tamaira Ciriello S 11/04/2016,5:02 AM

## 2016-11-04 NOTE — Plan of Care (Signed)
Problem: Nutrition: Goal: Adequate nutrition will be maintained Outcome: Progressing , according to pt. Her appetite was much better today

## 2016-11-04 NOTE — Consult Note (Signed)
Carla Wilson is a 66 y.o. female  [id number]  Primary Cardiologist: Dr. Adrian Blackwater  Reason for Consultation: Elevated troponin  HPI: 66 year old female with a PMH of pulmonary disease, rheumatoid arthritis, anxiety, and collagen vascular disease presented with progressive shortness of breath and chest pain and was found to have mildly elevated troponin. CT Chest were positive for bilateral upper lobe infiltrates and is being treated for pneumonia. CT negative for pulmonary embolism but aortic atherosclerosis noted without dissection or diltation. No prior history of CAD.      Review of Systems: Resting comfortably, difficult to arouse but denies chest pain or shortness of breath.   Past Medical History:  Diagnosis Date  . Anxiety   . Collagen vascular disease (HCC)   . Depression   . Fibromyalgia   . Pulmonary disease    NOS  . RA (rheumatoid arthritis) (HCC)     Medications Prior to Admission  Medication Sig Dispense Refill  . albuterol (PROVENTIL HFA;VENTOLIN HFA) 108 (90 Base) MCG/ACT inhaler Inhale 1-2 puffs into the lungs every 6 (six) hours as needed for wheezing or shortness of breath.    . ALPRAZolam (XANAX) 1 MG tablet Take 1 mg by mouth 4 (four) times daily as needed for anxiety.    . citalopram (CELEXA) 40 MG tablet Take 40 mg by mouth daily.    . DULoxetine (CYMBALTA) 60 MG capsule Take 1 capsule (60 mg total) by mouth daily. 30 capsule 6  . estradiol (CLIMARA - DOSED IN MG/24 HR) 0.05 mg/24hr patch Place 1 patch onto the skin once a week.    . etanercept (ENBREL) 25 MG injection Inject into the skin once a week.    Marland Kitchen levofloxacin (LEVAQUIN) 250 MG tablet Take 250 mg by mouth daily.    Marland Kitchen oxyCODONE (ROXICODONE) 15 MG immediate release tablet Take 15 mg by mouth 4 (four) times daily.    . traMADol (ULTRAM) 50 MG tablet Take 50 mg by mouth 2 (two) times daily.    Marland Kitchen zolpidem (AMBIEN) 10 MG tablet Take 10 mg by mouth at bedtime as needed for sleep.       Marland Kitchen  ALPRAZolam  0.25 mg Oral Once  . citalopram  40 mg Oral Daily  . DULoxetine  60 mg Oral Daily  . methylPREDNISolone (SOLU-MEDROL) injection  60 mg Intravenous Q6H  . oxyCODONE  15 mg Oral QID    Infusions: . aztreonam    . heparin 600 Units/hr (11/04/16 0444)  . vancomycin      Allergies  Allergen Reactions  . Aspirin Other (See Comments)    Upset stomach  . Meperidine Hcl Nausea And Vomiting  . Penicillins Swelling    Has patient had a PCN reaction causing immediate rash, facial/tongue/throat swelling, SOB or lightheadedness with hypotension: Yes Has patient had a PCN reaction causing severe rash involving mucus membranes or skin necrosis: No Has patient had a PCN reaction that required hospitalization: No Has patient had a PCN reaction occurring within the last 10 years: No If all of the above answers are "NO", then may proceed with Cephalosporin use.  . Sulfa Antibiotics Nausea Only    Social History   Social History  . Marital status: Widowed    Spouse name: N/A  . Number of children: N/A  . Years of education: N/A   Occupational History  . Not on file.   Social History Main Topics  . Smoking status: Never Smoker  . Smokeless tobacco: Never Used  .  Alcohol use No  . Drug use: No  . Sexual activity: Not on file   Other Topics Concern  . Not on file   Social History Narrative  . No narrative on file    Family History  Problem Relation Age of Onset  . Cancer Mother   . Arthritis Father   . Cancer Father   . Cancer Sister   . Heart disease Maternal Grandfather   . Stroke Paternal Grandfather     PHYSICAL EXAM: Vitals:   11/04/16 0354 11/04/16 0805  BP: (!) 167/94 (!) 157/81  Pulse: 96 82  Resp: 18   Temp: 97.6 F (36.4 C) 97.9 F (36.6 C)  SpO2: 97% 95%     Intake/Output Summary (Last 24 hours) at 11/04/16 0838 Last data filed at 11/04/16 0615  Gross per 24 hour  Intake             1000 ml  Output              600 ml  Net               400 ml    General:  Well appearing. No respiratory difficulty HEENT: normal Neck: supple. no JVD. Carotids 2+ bilat; no bruits. No lymphadenopathy or thryomegaly appreciated. Cor: PMI nondisplaced. Regular rate & rhythm. No rubs, gallops or murmurs. Lungs: clear Abdomen: soft, nontender, nondistended. No hepatosplenomegaly. No bruits or masses. Good bowel sounds. Extremities: no cyanosis, clubbing, rash, edema Neuro: alert & oriented x 3, cranial nerves grossly intact. moves all 4 extremities w/o difficulty. Affect pleasant.  ECG: NSR 88bpm  Results for orders placed or performed during the hospital encounter of 11/03/16 (from the past 24 hour(s))  Basic metabolic panel     Status: Abnormal   Collection Time: 11/03/16  6:21 PM  Result Value Ref Range   Sodium 133 (L) 135 - 145 mmol/L   Potassium 2.8 (L) 3.5 - 5.1 mmol/L   Chloride 96 (L) 101 - 111 mmol/L   CO2 25 22 - 32 mmol/L   Glucose, Bld 119 (H) 65 - 99 mg/dL   BUN 14 6 - 20 mg/dL   Creatinine, Ser 1.61 0.44 - 1.00 mg/dL   Calcium 9.0 8.9 - 09.6 mg/dL   GFR calc non Af Amer >60 >60 mL/min   GFR calc Af Amer >60 >60 mL/min   Anion gap 12 5 - 15  CBC     Status: Abnormal   Collection Time: 11/03/16  6:21 PM  Result Value Ref Range   WBC 6.7 3.6 - 11.0 K/uL   RBC 4.07 3.80 - 5.20 MIL/uL   Hemoglobin 11.5 (L) 12.0 - 16.0 g/dL   HCT 04.5 (L) 40.9 - 81.1 %   MCV 84.5 80.0 - 100.0 fL   MCH 28.1 26.0 - 34.0 pg   MCHC 33.3 32.0 - 36.0 g/dL   RDW 91.4 (H) 78.2 - 95.6 %   Platelets 282 150 - 440 K/uL  Troponin I     Status: Abnormal   Collection Time: 11/03/16  6:21 PM  Result Value Ref Range   Troponin I 0.19 (HH) <0.03 ng/mL  Differential     Status: Abnormal   Collection Time: 11/03/16  6:21 PM  Result Value Ref Range   Neutrophils Relative % 78 %   Neutro Abs 5.3 1.4 - 6.5 K/uL   Lymphocytes Relative 11 %   Lymphs Abs 0.7 (L) 1.0 - 3.6 K/uL   Monocytes Relative 9 %  Monocytes Absolute 0.6 0.2 - 0.9 K/uL    Eosinophils Relative 1 %   Eosinophils Absolute 0.1 0 - 0.7 K/uL   Basophils Relative 1 %   Basophils Absolute 0.1 0 - 0.1 K/uL  Troponin I     Status: Abnormal   Collection Time: 11/03/16 11:02 PM  Result Value Ref Range   Troponin I 0.13 (HH) <0.03 ng/mL  Lactic acid, plasma     Status: None   Collection Time: 11/03/16 11:02 PM  Result Value Ref Range   Lactic Acid, Venous 1.1 0.5 - 1.9 mmol/L  Lactic acid, plasma     Status: None   Collection Time: 11/04/16  2:15 AM  Result Value Ref Range   Lactic Acid, Venous 0.9 0.5 - 1.9 mmol/L  APTT     Status: None   Collection Time: 11/04/16  2:15 AM  Result Value Ref Range   aPTT 33 24 - 36 seconds  Protime-INR     Status: None   Collection Time: 11/04/16  2:15 AM  Result Value Ref Range   Prothrombin Time 14.8 11.4 - 15.2 seconds   INR 1.17   Troponin I     Status: Abnormal   Collection Time: 11/04/16  2:15 AM  Result Value Ref Range   Troponin I 0.13 (HH) <0.03 ng/mL  Basic metabolic panel     Status: Abnormal   Collection Time: 11/04/16  2:15 AM  Result Value Ref Range   Sodium 131 (L) 135 - 145 mmol/L   Potassium 3.2 (L) 3.5 - 5.1 mmol/L   Chloride 100 (L) 101 - 111 mmol/L   CO2 22 22 - 32 mmol/L   Glucose, Bld 211 (H) 65 - 99 mg/dL   BUN 12 6 - 20 mg/dL   Creatinine, Ser 9.60 0.44 - 1.00 mg/dL   Calcium 8.0 (L) 8.9 - 10.3 mg/dL   GFR calc non Af Amer >60 >60 mL/min   GFR calc Af Amer >60 >60 mL/min   Anion gap 9 5 - 15  CBC     Status: Abnormal   Collection Time: 11/04/16  2:15 AM  Result Value Ref Range   WBC 3.4 (L) 3.6 - 11.0 K/uL   RBC 3.56 (L) 3.80 - 5.20 MIL/uL   Hemoglobin 9.9 (L) 12.0 - 16.0 g/dL   HCT 45.4 (L) 09.8 - 11.9 %   MCV 81.8 80.0 - 100.0 fL   MCH 27.8 26.0 - 34.0 pg   MCHC 34.0 32.0 - 36.0 g/dL   RDW 14.7 (H) 82.9 - 56.2 %   Platelets 244 150 - 440 K/uL  Troponin I     Status: Abnormal   Collection Time: 11/04/16  7:47 AM  Result Value Ref Range   Troponin I 0.09 (HH) <0.03 ng/mL   Dg  Chest 2 View  Result Date: 11/03/2016 CLINICAL DATA:  Cough and shortness of breath. EXAM: CHEST  2 VIEW COMPARISON:  Two-view chest x-ray 04/07/2011 FINDINGS: Heart size is normal. The lungs are hyperinflated. Asymmetric left upper lobe airspace disease is present. There is no cavitation. The right lung is clear. No significant effusions are present. IMPRESSION: 1. Left upper lobe airspace disease consistent with pneumonia. 2. Stable hyperinflation. 3. Scoliosis. Electronically Signed   By: Marin Roberts M.D.   On: 11/03/2016 18:41   Ct Angio Chest Pe W And/or Wo Contrast  Result Date: 11/03/2016 CLINICAL DATA:  Chest pain, generalized weakness for 3 weeks, weight loss of greater than 25 pounds, pale, anorexia, rheumatoid arthritis,  fibromyalgia, high pretest probability of pulmonary embolism EXAM: CT ANGIOGRAPHY CHEST WITH CONTRAST TECHNIQUE: Multidetector CT imaging of the chest was performed using the standard protocol during bolus administration of intravenous contrast. Multiplanar CT image reconstructions and MIPs were obtained to evaluate the vascular anatomy. CONTRAST:  75 cc Isovue 370 IV COMPARISON:  04/08/2009, 03/01/2006 FINDINGS: Cardiovascular: Atherosclerotic calcifications aorta without aneurysm or dissection. Pulmonary arteries adequately opacified and patent. No evidence of pulmonary embolism. No pericardial effusion. Mediastinum/Nodes: RIGHT thyroid mass 26 x 18 mm image 5, increased since 2008 with a smaller less well-defined nodule at the inferior pole of the RIGHT lobe also noted. Scattered normal sized mediastinal, hilar, and LEFT axillary nodes. Esophagus unremarkable. Lungs/Pleura: Scattered peribronchial thickening. Progressive scattered ground-glass opacities in BILATERAL upper lobes which could be due to infection, bronchiolitis, or potentially rheumatoid lung disease. No pleural effusion or pneumothorax. No discrete pulmonary nodule/mass. Upper Abdomen: Unremarkable  Musculoskeletal: No acute osseous findings. Review of the MIP images confirms the above findings. IMPRESSION: No evidence of pulmonary embolism. Progressive ground-glass opacities bilaterally with upper lobe predominance which could be due to bronchiolitis, rheumatoid lung disease, or infection. RIGHT thyroid nodules largest 26 x 18 mm increased since 2008; followup nonemergent thyroid sonography recommended. Aortic Atherosclerosis (ICD10-I70.0). Electronically Signed   By: Ulyses Southward M.D.   On: 11/03/2016 22:08     ASSESSMENT AND PLAN: Mildly elevated troponin likely due to demand ischemia.  Dr. Welton Flakes read echo and transmitted that vegetation was present on tricuspid valve. Blood cultures pending. Advise treatment for endocarditis.  Low risk for acute coronary syndrome, advise outpatient stress testing after discharge to rule out coronary artery disease and cardiology follow up for secondary prevention of CAD/PAD due to atherosclerosis on CT chest. Will continue to follow.  Caroleen Hamman, NP-C

## 2016-11-04 NOTE — Progress Notes (Signed)
Pt's vitals stable, on room air and afebrile. Pt ambulates to bathroom with standby assist. Pt is receiving IV abx and awaiting blood cultures. Pt has chronic pain from fibromyalgia and arthritis, but no chest pain.

## 2016-11-05 LAB — URINALYSIS, COMPLETE (UACMP) WITH MICROSCOPIC
BILIRUBIN URINE: NEGATIVE
Glucose, UA: NEGATIVE mg/dL
Hgb urine dipstick: NEGATIVE
KETONES UR: 5 mg/dL — AB
Leukocytes, UA: NEGATIVE
Nitrite: NEGATIVE
PH: 5 (ref 5.0–8.0)
PROTEIN: NEGATIVE mg/dL
Specific Gravity, Urine: 1.029 (ref 1.005–1.030)

## 2016-11-05 LAB — BASIC METABOLIC PANEL
Anion gap: 6 (ref 5–15)
BUN: 21 mg/dL — AB (ref 6–20)
CHLORIDE: 101 mmol/L (ref 101–111)
CO2: 27 mmol/L (ref 22–32)
CREATININE: 0.54 mg/dL (ref 0.44–1.00)
Calcium: 8.4 mg/dL — ABNORMAL LOW (ref 8.9–10.3)
GFR calc non Af Amer: >60 mL/min (ref >60)
GLUCOSE: 102 mg/dL — AB (ref 65–99)
POTASSIUM: 4.4 mmol/L (ref 3.5–5.1)
SODIUM: 134 mmol/L — AB (ref 135–145)

## 2016-11-05 MED ORDER — BENZONATATE 200 MG PO CAPS: 200.0000 mg | ORAL_CAPSULE | Freq: Three times a day (TID) | ORAL | 0 refills | Status: DC

## 2016-11-05 MED ORDER — SENNOSIDES-DOCUSATE SODIUM 8.6-50 MG PO TABS: 1.0000 | ORAL_TABLET | Freq: Two times a day (BID) | ORAL | 1 refills | Status: AC

## 2016-11-05 MED ORDER — POLYETHYLENE GLYCOL 3350 17 G PO PACK
17.0000 g | PACK | Freq: Every day | ORAL | Status: DC
  Administered 2016-11-05: 17 g via ORAL
  Filled 2016-11-05: qty 1

## 2016-11-05 MED ORDER — DOXYCYCLINE HYCLATE 100 MG PO CAPS: 100.0000 mg | ORAL_CAPSULE | Freq: Two times a day (BID) | ORAL | 0 refills | Status: DC

## 2016-11-05 MED ORDER — ZOLPIDEM TARTRATE 5 MG PO TABS: 5.0000 mg | ORAL_TABLET | Freq: Every evening | ORAL | 0 refills | Status: DC | PRN

## 2016-11-05 MED ORDER — SENNOSIDES-DOCUSATE SODIUM 8.6-50 MG PO TABS
1.0000 | ORAL_TABLET | Freq: Two times a day (BID) | ORAL | Status: DC
  Administered 2016-11-05: 1 via ORAL
  Filled 2016-11-05: qty 1

## 2016-11-05 MED ORDER — PREDNISONE 10 MG (21) PO TBPK: ORAL_TABLET | ORAL | 0 refills | Status: DC

## 2016-11-05 NOTE — Care Management Note (Signed)
Case Management Note  Patient Details  Name: SYDNIE SIGMUND MRN: [id number] Date of Birth: [birth year]  Subjective/Objective:     Sister Lupita Leash Cochrane reports that she is going by Mrs Pew's home to pick up some clothing for Mrs Haydu and will transport Mrs Putnam home in approximately 1 hour.              Action/Plan:   Expected Discharge Date:  11/05/16               Expected Discharge Plan:  Home w Home Health Services  In-House Referral:  NA  Discharge planning Services  NA  Post Acute Care Choice:  NA Choice offered to:  Patient  DME Arranged:  N/A DME Agency:  NA  HH Arranged:  RN, Social Work [employer] Agency:  [employer] [facility]  Status of Service:  Completed, signed off  If discussed at [employer], dates discussed:    Additional Comments:  [place] Rico A, RN 11/05/2016, 2:57 PM
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
## 2016-11-05 NOTE — Progress Notes (Signed)
SUBJECTIVE: Pt is feeling well today. Denies chest pain or shortness of breath.    Vitals:   11/04/16 1433 11/04/16 2009 11/05/16 0401 11/05/16 0749  BP: 121/77 140/76 (!) 147/82 139/81  Pulse: 87 74 85 79  Resp: 12 18 18 20   Temp: 98.4 F (36.9 C) 98.4 F (36.9 C) 98.4 F (36.9 C) 98.2 F (36.8 C)  TempSrc: Oral Oral Oral Oral  SpO2: 92% 94% 91% 94%  Weight:      Height:        Intake/Output Summary (Last 24 hours) at 11/05/16 0751 Last data filed at 11/05/16 0356  Gross per 24 hour  Intake              300 ml  Output             1600 ml  Net            -1300 ml    LABS: Basic Metabolic Panel:  Recent Labs  01/05/17 0215 11/05/16 0612  NA 131* 134*  K 3.2* 4.4  CL 100* 101  CO2 22 27  GLUCOSE 211* 102*  BUN 12 21*  CREATININE 0.65 0.54  CALCIUM 8.0* 8.4*   Liver Function Tests: No results for input(s): AST, ALT, ALKPHOS, BILITOT, PROT, ALBUMIN in the last 72 hours. No results for input(s): LIPASE, AMYLASE in the last 72 hours. CBC:  Recent Labs  11/03/16 1821 11/04/16 0215  WBC 6.7 3.4*  NEUTROABS 5.3  --   HGB 11.5* 9.9*  HCT 34.4* 29.1*  MCV 84.5 81.8  PLT 282 244   Cardiac Enzymes:  Recent Labs  11/04/16 0215 11/04/16 0747 11/04/16 1224  TROPONINI 0.13* 0.09* 0.09*   BNP: Invalid input(s): POCBNP D-Dimer: No results for input(s): DDIMER in the last 72 hours. Hemoglobin A1C: No results for input(s): HGBA1C in the last 72 hours. Fasting Lipid Panel: No results for input(s): CHOL, HDL, LDLCALC, TRIG, CHOLHDL, LDLDIRECT in the last 72 hours. Thyroid Function Tests: No results for input(s): TSH, T4TOTAL, T3FREE, THYROIDAB in the last 72 hours.  Invalid input(s): FREET3 Anemia Panel: No results for input(s): VITAMINB12, FOLATE, FERRITIN, TIBC, IRON, RETICCTPCT in the last 72 hours.   PHYSICAL EXAM General: Well developed, well nourished, in no acute distress HEENT:  Normocephalic and atramatic Neck:  No JVD.  Lungs: Clear  bilaterally to auscultation and percussion. Heart: HRRR . Normal S1 and S2 without gallops or murmurs.  Abdomen: Bowel sounds are positive, abdomen soft and non-tender  Msk:  Back normal, normal gait. Normal strength and tone for age. Extremities: No clubbing, cyanosis or edema.   Neuro: Alert and oriented X 3. Psych:  Good affect, responds appropriately  TELEMETRY: NSR 89bpm  ASSESSMENT AND PLAN: Mildly elevated troponin likely due to demand ischemia. Sepsis with vegetation present on tricuspid valve. Normal wall motion and normal LVEF on echo yesterday. Vital signs stable. Continue antibiotics and advise outpatient stress testing and cardiology follow up.  Principal Problem:   NSTEMI (non-ST elevated myocardial infarction) [facility]) Active Problems:   Anxiety   Rheumatoid arthritis (HCC)   Depression   CAP (community acquired pneumonia)    [facility], NP-C 11/05/2016 7:51 AM

## 2016-11-05 NOTE — Discharge Summary (Signed)
[facility] [place] [facility]   PATIENT NAME: Carla Wilson    MR#:  [id number]  DATE OF BIRTH:  01-Nov-1950  DATE OF ADMISSION:  11/03/2016   ADMITTING PHYSICIAN: Oralia Manis, MD  DATE OF DISCHARGE:  11/05/16  PRIMARY CARE PHYSICIAN: System, Pcp Not In   ADMISSION DIAGNOSIS:   Elevated troponin [R74.8] Multifocal pneumonia [J18.9]  DISCHARGE DIAGNOSIS:   Principal Problem:   NSTEMI (non-ST elevated myocardial infarction) (HCC) Active Problems:   Anxiety   Rheumatoid arthritis (HCC)   Depression   CAP (community acquired pneumonia)   SECONDARY DIAGNOSIS:   Past Medical History:  Diagnosis Date  . Anxiety   . Collagen vascular disease (HCC)   . Depression   . Fibromyalgia   . Pulmonary disease    NOS  . RA (rheumatoid arthritis) [facility])     HOSPITAL COURSE:   66 year old female with past medical history significant for rheumatoid arthritis, fibromyalgia and collagenous vascular disease and depression comes to hospital secondary to several days history of shortness of breath, chest pain and also loss of appetite  #1 Sepsis- secondary to multifocal pneumonia - Negative blood cultures, was on vancomycin and aztreonam due to antibiotic allergies -Being discharged on doxycycline - Received steroids for reactive airway disease, started on prednisone taper at discharge. -On room air currently. Continue cough medications.  #2 elevated troponin-likely demand ischemia from underlying sepsis. -Appreciate cardiology consult. Outpatient stress test if needed. -Echocardiogram with no wall motion abnormalities. Questionable vegetation on tricuspid valve but not mentioned in the formal report. Follow up with cardiology as outpatient. Blood cultures are negative at this time  #3 fibromyalgia and chronic pain from arthritis-continue home medications Also has severe rheumatoid arthritis.  #4 depression-on Celexa and Cymbalta  #5  hypokalemia-replaced  Physical therapy recommended outpatient PT. Will be discharged with home health nursing.   DISCHARGE CONDITIONS:   Guarded  CONSULTS OBTAINED:   Treatment Team:  Laurier Nancy, MD  DRUG ALLERGIES:   Allergies  Allergen Reactions  . Aspirin Other (See Comments)    Upset stomach  . Meperidine Hcl Nausea And Vomiting  . Penicillins Swelling    Has patient had a PCN reaction causing immediate rash, facial/tongue/throat swelling, SOB or lightheadedness with hypotension: Yes Has patient had a PCN reaction causing severe rash involving mucus membranes or skin necrosis: No Has patient had a PCN reaction that required hospitalization: No Has patient had a PCN reaction occurring within the last 10 years: No If all of the above answers are "NO", then may proceed with Cephalosporin use.  . Sulfa Antibiotics Nausea Only   DISCHARGE MEDICATIONS:   Allergies as of 11/05/2016      Reactions   Aspirin Other (See Comments)   Upset stomach   Meperidine Hcl Nausea And Vomiting   Penicillins Swelling   Has patient had a PCN reaction causing immediate rash, facial/tongue/throat swelling, SOB or lightheadedness with hypotension: Yes Has patient had a PCN reaction causing severe rash involving mucus membranes or skin necrosis: No Has patient had a PCN reaction that required hospitalization: No Has patient had a PCN reaction occurring within the last 10 years: No If all of the above answers are "NO", then may proceed with Cephalosporin use.   Sulfa Antibiotics Nausea Only      Medication List    STOP taking these medications   levofloxacin 250 MG tablet Commonly known as:  LEVAQUIN     TAKE these medications   albuterol 108 (90  Base) MCG/ACT inhaler Commonly known as:  PROVENTIL HFA;VENTOLIN HFA Inhale 1-2 puffs into the lungs every 6 (six) hours as needed for wheezing or shortness of breath.   ALPRAZolam 1 MG tablet Commonly known as:  XANAX Take 1 mg by  mouth 4 (four) times daily as needed for anxiety.   benzonatate 200 MG capsule Commonly known as:  TESSALON Take 1 capsule (200 mg total) by mouth 3 (three) times daily.   citalopram 40 MG tablet Commonly known as:  CELEXA Take 40 mg by mouth daily.   doxycycline 100 MG capsule Commonly known as:  VIBRAMYCIN Take 1 capsule (100 mg total) by mouth 2 (two) times daily. X 10 days   DULoxetine 60 MG capsule Commonly known as:  CYMBALTA Take 1 capsule (60 mg total) by mouth daily.   estradiol 0.05 mg/24hr patch Commonly known as:  CLIMARA - Dosed in mg/24 hr Place 1 patch onto the skin once a week.   etanercept 25 MG injection Commonly known as:  ENBREL Inject into the skin once a week.   oxyCODONE 15 MG immediate release tablet Commonly known as:  ROXICODONE Take 15 mg by mouth 4 (four) times daily.   predniSONE 10 MG (21) Tbpk tablet Commonly known as:  STERAPRED UNI-PAK 21 TAB 6 tabs PO x 1 day 5 tabs PO x 1 day 4 tabs PO x 1 day 3 tabs PO x 1 day 2 tabs PO x 1 day 1 tab PO x 1 day and stop   senna-docusate 8.6-50 MG tablet Commonly known as:  Senokot-S Take 1 tablet by mouth 2 (two) times daily.   traMADol 50 MG tablet Commonly known as:  ULTRAM Take 50 mg by mouth 2 (two) times daily.   zolpidem 5 MG tablet Commonly known as:  AMBIEN Take 1 tablet (5 mg total) by mouth at bedtime as needed for sleep. What changed:  medication strength  how much to take        DISCHARGE INSTRUCTIONS:   1. PCP follow-up in 1-2 weeks  DIET:   Cardiac diet  ACTIVITY:   Activity as tolerated  OXYGEN:   Home Oxygen: No.  Oxygen Delivery: room air  DISCHARGE LOCATION:   home   If you experience worsening of your admission symptoms, develop shortness of breath, life threatening emergency, suicidal or homicidal thoughts you must seek medical attention immediately by calling 911 or calling your MD immediately  if symptoms less severe.  You Must read complete  instructions/literature along with all the possible adverse reactions/side effects for all the Medicines you take and that have been prescribed to you. Take any new Medicines after you have completely understood and accpet all the possible adverse reactions/side effects.   Please note  You were cared for by a hospitalist during your hospital stay. If you have any questions about your discharge medications or the care you received while you were in the hospital after you are discharged, you can call the unit and asked to speak with the hospitalist on call if the hospitalist that took care of you is not available. Once you are discharged, your primary care physician will handle any further medical issues. Please note that NO REFILLS for any discharge medications will be authorized once you are discharged, as it is imperative that you return to your primary care physician (or establish a relationship with a primary care physician if you do not have one) for your aftercare needs so that they can reassess your need for  medications and monitor your lab values.    On the day of Discharge:  VITAL SIGNS:   Blood pressure (!) 141/87, pulse 85, temperature 97.7 F (36.5 C), temperature source Oral, resp. rate 20, height 5\' 4"  (1.626 m), weight 51.5 kg (113 lb 9.6 oz), SpO2 95 %.  PHYSICAL EXAMINATION:    GENERAL:  66 y.o.-year-old very thin patient lying in the bed with no acute distress.  EYES: Pupils equal, round, reactive to light and accommodation. No scleral icterus. Extraocular muscles intact.  HEENT: Head atraumatic, normocephalic. Oropharynx and nasopharynx clear.  NECK:  Supple, no jugular venous distention. No thyroid enlargement, no tenderness.  LUNGS: Normal breath sounds bilaterally, no wheezing, rales,rhonchi or crepitation. No use of accessory muscles of respiration. Decreased bibasilar breath sounds. CARDIOVASCULAR: S1, S2 normal. No   rubs, or gallops. 3/6 systolic murmur  present ABDOMEN: Soft, nontender, nondistended. Bowel sounds present. No organomegaly or mass.  EXTREMITIES: No pedal edema, cyanosis, or clubbing. Arthritis changes of hands NEUROLOGIC: Cranial nerves II through XII are intact. Muscle strength 5/5 in all extremities. Sensation intact. Gait not checked.  PSYCHIATRIC: The patient is alert and oriented x 3.  SKIN: No obvious rash, lesion, or ulcer.   DATA REVIEW:   CBC  Recent Labs Lab 11/04/16 0215  WBC 3.4*  HGB 9.9*  HCT 29.1*  PLT 244    Chemistries   Recent Labs Lab 11/05/16 0612  NA 134*  K 4.4  CL 101  CO2 27  GLUCOSE 102*  BUN 21*  CREATININE 0.54  CALCIUM 8.4*     Microbiology Results  Results for orders placed or performed during the hospital encounter of 11/03/16  Blood Culture (routine x 2)     Status: None (Preliminary result)   Collection Time: 11/03/16 11:02 PM  Result Value Ref Range Status   Specimen Description BLOOD RIGHT ANTECUBITAL  Final   Special Requests   Final    BOTTLES DRAWN AEROBIC AND ANAEROBIC Blood Culture adequate volume   Culture NO GROWTH 2 DAYS  Final   Report Status PENDING  Incomplete  Blood Culture (routine x 2)     Status: None (Preliminary result)   Collection Time: 11/03/16 11:02 PM  Result Value Ref Range Status   Specimen Description BLOOD RIGHT FOREARM  Final   Special Requests   Final    BOTTLES DRAWN AEROBIC AND ANAEROBIC Blood Culture adequate volume   Culture NO GROWTH 2 DAYS  Final   Report Status PENDING  Incomplete    RADIOLOGY:  No results found.   Management plans discussed with the patient, family and they are in agreement.  CODE STATUS:     Code Status Orders        Start     Ordered   11/04/16 0035  Full code  Continuous     11/04/16 0034    Code Status History    Date Active Date Inactive Code Status Order ID Comments User Context   09/18/2011 11:50 PM 09/19/2011  4:33 PM Full Code [id number]  Raeford Razor, MD ED      TOTAL TIME TAKING  CARE OF THIS PATIENT: 37 minutes.    Broxton Broady M.D on 11/05/2016 at 1:09 PM  Between 7am to 6pm - Pager - [phone]  After 6pm go to [email or web address] - Social research officer, government  [facility] [place] [facility]  Office  [phone]  CC: Primary care physician; System, Pcp Not In   Note: This dictation was prepared with Dragon dictation along with smaller  Company secretary. Any transcriptional errors that result from this process are unintentional.

## 2016-11-05 NOTE — Progress Notes (Signed)
Physical Therapy Evaluation Patient Details Name: Carla Wilson MRN: [id number] DOB: [birth year] Today's Date: 11/05/2016   History of Present Illness  Patient is a 66 y.o. female admitted on 03 Nov 2016 for progressive SOB and chest pain for 24 hours. Found to have multifocal pneumonia. PMH includes anxiety, RA, and depression.  Clinical Impression  Patient presents to hospital for above listed reasons. Patient previously living at home with grandson with assistance from him and sisters for groceries. Patient denies recent falls but admits to difficulty rising from toilet in mornings at times. Patient on evaluation demonstrates independence with bed mobility and transfers. Ambulates at decreased cadence with minimal trunk rotation but demonstrates no LOB. Patient is deemed to be at baseline level of function and is safe to d/c home when medically ready. Patient may benefit from raised toilet seat to allow for easier STS transfer at home.    Follow Up Recommendations No PT follow up    Equipment Recommendations  Other (comment) (Raised toilet seat)    Recommendations for Other Services       Precautions / Restrictions Precautions Precautions: None Restrictions Weight Bearing Restrictions: No      Mobility  Bed Mobility Overal bed mobility: Independent             General bed mobility comments: Patient performs bed mobility independently.  Transfers Overall transfer level: Modified independent Equipment used: None             General transfer comment: Patient moves from sit to stand and stand to sit with good safety awareness.  Ambulation/Gait Ambulation/Gait assistance: Min guard Ambulation Distance (Feet): 45 Feet Assistive device: None       General Gait Details: Patient ambulates at decreased cadence with minimal trunk rotation. Ambulated 25' with no LOB then requested to use restroom. Performed toileting independently.  Stairs            Wheelchair  Mobility    Modified Rankin (Stroke Patients Only)       Balance Overall balance assessment: Modified Independent                                           Pertinent Vitals/Pain Pain Assessment: No/denies pain    Home Living Family/patient expects to be discharged to:: Private residence Living Arrangements: Other relatives;Other (Comment) (Grandson) Available Help at Discharge: Family;Available PRN/intermittently Type of Home: House Home Access: Stairs to enter   [employer] of Steps: 3 Home Layout: One level Home Equipment: Shower seat      Prior Function Level of Independence: Needs assistance   Gait / Transfers Assistance Needed: Performed for household distances independently  ADL's / Homemaking Assistance Needed: Performed by grandson; sisters assist with groceries        Hand Dominance        Extremity/Trunk Assessment   Upper Extremity Assessment Upper Extremity Assessment: Generalized weakness    Lower Extremity Assessment Lower Extremity Assessment: Generalized weakness       Communication   Communication: No difficulties  Cognition Arousal/Alertness: Awake/alert Behavior During Therapy: WFL for tasks assessed/performed Overall Cognitive Status: Within Functional Limits for tasks assessed                                        General Comments  Exercises     Assessment/Plan    PT Assessment Patent does not need any further PT services  PT Problem List         PT Treatment Interventions      PT Goals (Current goals can be found in the Care Plan section)  Acute Rehab PT Goals Patient Stated Goal: "To get stronger" PT Goal Formulation: With patient Time For Goal Achievement: 11/19/16 Potential to Achieve Goals: Good    Frequency     Barriers to discharge        Co-evaluation               AM-PAC PT "6 Clicks" Daily Activity  Outcome Measure Difficulty turning over in  bed (including adjusting bedclothes, sheets and blankets)?: None Difficulty moving from lying on back to sitting on the side of the bed? : None Difficulty sitting down on and standing up from a chair with arms (e.g., wheelchair, bedside commode, etc,.)?: None Help needed moving to and from a bed to chair (including a wheelchair)?: None Help needed walking in hospital room?: A Little Help needed climbing 3-5 steps with a railing? : A Little 6 Click Score: 22    End of Session Equipment Utilized During Treatment: Gait belt Activity Tolerance: Patient tolerated treatment well Patient left: in chair;with call bell/phone within reach;with nursing/sitter in room;with chair alarm set   PT Visit Diagnosis: Muscle weakness (generalized) (M62.81)    Time: 1610-9604 PT Time Calculation (min) (ACUTE ONLY): 17 min   Charges:   PT Evaluation $PT Eval Low Complexity: 1 Low     PT G Codes:   PT G-Codes **NOT FOR INPATIENT CLASS** Functional Assessment Tool Used: AM-PAC 6 Clicks Basic Mobility;Clinical judgement Functional Limitation: Mobility: Walking and moving around Mobility: Walking and Moving Around Current Status (V4098): At least 20 percent but less than 40 percent impaired, limited or restricted Mobility: Walking and Moving Around Goal Status [phone]): At least 20 percent but less than 40 percent impaired, limited or restricted Mobility: Walking and Moving Around Discharge Status [phone]): At least 20 percent but less than 40 percent impaired, limited or restricted      Neita Carp, PT, DPT 11/05/2016, 9:26 AM

## 2016-11-05 NOTE — Plan of Care (Signed)
Problem: Health Behavior/Discharge Planning: Goal: Ability to manage health-related needs will improve Outcome: Adequate for Discharge Care needs at home have only changed a little from pre admission.  Patient's biggest concern is taking care of her grandson.  SW linked her to resources in the community

## 2016-11-05 NOTE — Care Management Note (Signed)
Case Management Note  Patient Details  Name: Carla Wilson MRN: [id number] Date of Birth: 08/06/1950  Subjective/Objective:  Discussed discharge planning with [name]. She chose [facility] to be her home health provider after [facility] reported that they did not have RN staffing. A referral for HH=RN, SW was called to [place] at [facility].                   Action/Plan:   Expected Discharge Date:  11/05/16               Expected Discharge Plan:  Home w Home Health Services  In-House Referral:  NA  Discharge planning Services  NA  Post Acute Care Choice:  NA Choice offered to:  Patient  DME Arranged:  N/A DME Agency:  NA  HH Arranged:  RN, Social Work [employer] Agency:  [employer] [facility]  Status of Service:  Completed, signed off  If discussed at [employer], dates discussed:    Additional Comments:  Vianna Venezia A, RN 11/05/2016, 1:42 PM

## 2016-11-08 LAB — CULTURE, BLOOD (ROUTINE X 2)
CULTURE: NO GROWTH
Culture: NO GROWTH
SPECIAL REQUESTS: ADEQUATE
Special Requests: ADEQUATE

## 2016-11-09 LAB — HIV 1/2 AB DIFFERENTIATION
HIV 1 AB: NEGATIVE
HIV 2 Ab: NEGATIVE
NOTE (HIV CONF MULTISPOT): NEGATIVE

## 2016-11-09 LAB — RNA QUALITATIVE: HIV 1 RNA Qualitative: 1

## 2016-11-09 LAB — HIV ANTIBODY (ROUTINE TESTING W REFLEX): HIV SCREEN 4TH GENERATION: REACTIVE — AB

## 2016-12-11 ENCOUNTER — Inpatient Hospital Stay
Admission: EM | Admit: 2016-12-11 | Discharge: 2016-12-15 | DRG: 896 | Disposition: A | Discharge status: Skilled Nursing Facility | Payer: Medicare Other | Attending: Internal Medicine | Admitting: Internal Medicine

## 2016-12-11 ENCOUNTER — Emergency Department: Payer: Medicare Other

## 2016-12-11 ENCOUNTER — Other Ambulatory Visit: Payer: Self-pay

## 2016-12-11 DIAGNOSIS — E039 Hypothyroidism, unspecified: Secondary | ICD-10-CM | POA: Diagnosis present

## 2016-12-11 DIAGNOSIS — Z90710 Acquired absence of both cervix and uterus: Secondary | ICD-10-CM

## 2016-12-11 DIAGNOSIS — F13239 Sedative, hypnotic or anxiolytic dependence with withdrawal, unspecified: Secondary | ICD-10-CM | POA: Diagnosis not present

## 2016-12-11 DIAGNOSIS — T43225A Adverse effect of selective serotonin reuptake inhibitors, initial encounter: Secondary | ICD-10-CM | POA: Diagnosis present

## 2016-12-11 DIAGNOSIS — J019 Acute sinusitis, unspecified: Secondary | ICD-10-CM | POA: Diagnosis present

## 2016-12-11 DIAGNOSIS — E041 Nontoxic single thyroid nodule: Secondary | ICD-10-CM

## 2016-12-11 DIAGNOSIS — T43215A Adverse effect of selective serotonin and norepinephrine reuptake inhibitors, initial encounter: Secondary | ICD-10-CM | POA: Diagnosis present

## 2016-12-11 DIAGNOSIS — F23 Brief psychotic disorder: Secondary | ICD-10-CM | POA: Diagnosis present

## 2016-12-11 DIAGNOSIS — R410 Disorientation, unspecified: Secondary | ICD-10-CM | POA: Diagnosis not present

## 2016-12-11 DIAGNOSIS — Z79899 Other long term (current) drug therapy: Secondary | ICD-10-CM

## 2016-12-11 DIAGNOSIS — T424X5A Adverse effect of benzodiazepines, initial encounter: Secondary | ICD-10-CM | POA: Diagnosis present

## 2016-12-11 DIAGNOSIS — M069 Rheumatoid arthritis, unspecified: Secondary | ICD-10-CM | POA: Diagnosis present

## 2016-12-11 DIAGNOSIS — G92 Toxic encephalopathy: Secondary | ICD-10-CM | POA: Diagnosis present

## 2016-12-11 DIAGNOSIS — G894 Chronic pain syndrome: Secondary | ICD-10-CM | POA: Diagnosis present

## 2016-12-11 DIAGNOSIS — Z79891 Long term (current) use of opiate analgesic: Secondary | ICD-10-CM

## 2016-12-11 DIAGNOSIS — E871 Hypo-osmolality and hyponatremia: Secondary | ICD-10-CM | POA: Diagnosis present

## 2016-12-11 DIAGNOSIS — T426X5A Adverse effect of other antiepileptic and sedative-hypnotic drugs, initial encounter: Secondary | ICD-10-CM | POA: Diagnosis present

## 2016-12-11 DIAGNOSIS — E878 Other disorders of electrolyte and fluid balance, not elsewhere classified: Secondary | ICD-10-CM | POA: Diagnosis present

## 2016-12-11 DIAGNOSIS — Z9119 Patient's noncompliance with other medical treatment and regimen: Secondary | ICD-10-CM

## 2016-12-11 DIAGNOSIS — F329 Major depressive disorder, single episode, unspecified: Secondary | ICD-10-CM | POA: Diagnosis present

## 2016-12-11 DIAGNOSIS — G9340 Encephalopathy, unspecified: Secondary | ICD-10-CM

## 2016-12-11 DIAGNOSIS — E876 Hypokalemia: Secondary | ICD-10-CM | POA: Diagnosis present

## 2016-12-11 DIAGNOSIS — R441 Visual hallucinations: Secondary | ICD-10-CM

## 2016-12-11 DIAGNOSIS — T404X5A Adverse effect of other synthetic narcotics, initial encounter: Secondary | ICD-10-CM | POA: Diagnosis present

## 2016-12-11 DIAGNOSIS — M797 Fibromyalgia: Secondary | ICD-10-CM | POA: Diagnosis present

## 2016-12-11 DIAGNOSIS — T402X5A Adverse effect of other opioids, initial encounter: Secondary | ICD-10-CM | POA: Diagnosis present

## 2016-12-11 DIAGNOSIS — E860 Dehydration: Secondary | ICD-10-CM | POA: Diagnosis present

## 2016-12-11 HISTORY — DX: Rheumatoid arthritis, unspecified: M06.9

## 2016-12-11 LAB — BASIC METABOLIC PANEL WITH GFR
Anion gap: 16 — ABNORMAL HIGH (ref 5–15)
BUN: 9 mg/dL (ref 6–20)
CO2: 22 mmol/L (ref 22–32)
Calcium: 8.7 mg/dL — ABNORMAL LOW (ref 8.9–10.3)
Chloride: 94 mmol/L — ABNORMAL LOW (ref 101–111)
Creatinine, Ser: 0.54 mg/dL (ref 0.44–1.00)
GFR calc Af Amer: >60 mL/min (ref >60)
GFR calc non Af Amer: >60 mL/min (ref >60)
Glucose, Bld: 100 mg/dL — ABNORMAL HIGH (ref 65–99)
Potassium: 3.4 mmol/L — ABNORMAL LOW (ref 3.5–5.1)
Sodium: 132 mmol/L — ABNORMAL LOW (ref 135–145)

## 2016-12-11 LAB — URINALYSIS, COMPLETE (UACMP) WITH MICROSCOPIC
Bacteria, UA: NONE SEEN
Bilirubin Urine: NEGATIVE
Glucose, UA: NEGATIVE mg/dL
Ketones, ur: 5 mg/dL — AB
Leukocytes, UA: NEGATIVE
Nitrite: NEGATIVE
Protein, ur: NEGATIVE mg/dL
Specific Gravity, Urine: 1.01 (ref 1.005–1.030)
pH: 6 (ref 5.0–8.0)

## 2016-12-11 LAB — CBC
HCT: 34.3 % — ABNORMAL LOW (ref 35.0–47.0)
Hemoglobin: 11.2 g/dL — ABNORMAL LOW (ref 12.0–16.0)
MCH: 26.9 pg (ref 26.0–34.0)
MCHC: 32.5 g/dL (ref 32.0–36.0)
MCV: 82.9 fL (ref 80.0–100.0)
Platelets: 452 K/uL — ABNORMAL HIGH (ref 150–440)
RBC: 4.14 MIL/uL (ref 3.80–5.20)
RDW: 16.9 % — ABNORMAL HIGH (ref 11.5–14.5)
WBC: 8 K/uL (ref 3.6–11.0)

## 2016-12-11 LAB — TROPONIN I

## 2016-12-11 LAB — T4, FREE: FREE T4: 1.25 ng/dL — AB (ref 0.61–1.12)

## 2016-12-11 LAB — TSH: TSH: 0.183 u[IU]/mL — ABNORMAL LOW (ref 0.350–4.500)

## 2016-12-11 LAB — URINE DRUG SCREEN, QUALITATIVE (ARMC ONLY)
Amphetamines, Ur Screen: NOT DETECTED
Barbiturates, Ur Screen: NOT DETECTED
Benzodiazepine, Ur Scrn: POSITIVE — AB
CANNABINOID 50 NG, UR ~~LOC~~: NOT DETECTED
Cocaine Metabolite,Ur ~~LOC~~: NOT DETECTED
MDMA (ECSTASY) UR SCREEN: NOT DETECTED
Methadone Scn, Ur: NOT DETECTED
Opiate, Ur Screen: NOT DETECTED
Phencyclidine (PCP) Ur S: NOT DETECTED
TRICYCLIC, UR SCREEN: POSITIVE — AB

## 2016-12-11 LAB — ETHANOL

## 2016-12-11 LAB — GLUCOSE, CAPILLARY: Glucose-Capillary: 107 mg/dL — ABNORMAL HIGH (ref 65–99)

## 2016-12-11 MED ORDER — SODIUM CHLORIDE 0.9 % IV BOLUS (SEPSIS)
1000.0000 mL | Freq: Once | INTRAVENOUS | Status: AC
  Administered 2016-12-11: 1000 mL via INTRAVENOUS

## 2016-12-11 MED ORDER — OXYCODONE HCL 5 MG PO TABS
10.0000 mg | ORAL_TABLET | Freq: Once | ORAL | Status: AC
  Administered 2016-12-11: 10 mg via ORAL
  Filled 2016-12-11: qty 2

## 2016-12-11 NOTE — ED Triage Notes (Signed)
Pt states that she has been feeling dizzy for last 3 days. Pt family reports confusion for last 3 days. Pt alert and oriented X4, active, cooperative, pt in NAD. RR even and unlabored, color WNL.

## 2016-12-11 NOTE — ED Provider Notes (Signed)
[facility] Emergency Department Provider Note  ____________________________________________  Time seen: Approximately 8:17 PM  I have reviewed the triage vital signs and the nursing notes.   HISTORY  Chief Complaint Dizziness and Hallucinations  Level 5 caveat:  Portions of the history and physical were unable to be obtained due to ams   HPI Carla Wilson is a 66 y.o. female with a history of collagen vascular disease, fibromyalgia, rheumatoid arthritis, anxiety, depression who presents for evaluation of visual hallucinations and confusion. According to patient's sisters for the last 3 days patient has been having visual hallucinations. She lives with her grandson but is responsible for her own medications. This is very different of patient's baseline. They're not sure if she has been take her medications at home. She has called them multiple times a day for the last 3 days seeing people in the backyard, talking about ponies running into the house, feathers falling in the air. They deny any prior history of hallucinations. No fever, no vomiting, no diarrhea, no URI symptoms, no cough or congestion, no fever, no cough, no abdominal pain.No new medications.   Past Medical History:  Diagnosis Date  . Anxiety   . Collagen vascular disease (HCC)   . Depression   . Fibromyalgia   . Pulmonary disease    NOS  . RA (rheumatoid arthritis) (HCC)   . Rheumatoid arthritis [facility])     Patient Active Problem List   Diagnosis Date Noted  . Acute psychosis (HCC) 12/12/2016  . Acute delirium 12/12/2016  . Chronic prescription opiate use 12/12/2016  . Long term prescription benzodiazepine use 12/12/2016  . Depression 11/03/2016  . CAP (community acquired pneumonia) 11/03/2016  . NSTEMI (non-ST elevated myocardial infarction) (HCC) 11/03/2016  . Multiple contusions 04/13/2011  . URI (upper respiratory infection) 03/13/2011  . LEG PAIN, RIGHT 11/03/2009  .  NONTRAUMATIC RUPTURE OF UNSPECIFIED TENDON 09/21/2009  . PULMONARY DISEASE 06/17/2009  . HYPOTHYROIDISM 06/01/2009  . Anxiety 06/01/2009  . PANIC ATTACK 06/01/2009  . MIGRAINE HEADACHE 06/01/2009  . Rheumatoid arthritis (HCC) 06/01/2009  . FIBROMYALGIA 06/01/2009  . INSOMNIA 06/01/2009  . CHRONIC FATIGUE SYNDROME 06/01/2009    Past Surgical History:  Procedure Laterality Date  . ABDOMINAL HYSTERECTOMY    . CESAREAN SECTION      Prior to Admission medications   Medication Sig Start Date End Date Taking? Authorizing Provider  albuterol (PROVENTIL HFA;VENTOLIN HFA) 108 (90 Base) MCG/ACT inhaler Inhale 1-2 puffs into the lungs every 6 (six) hours as needed for wheezing or shortness of breath.   Yes [provider]  ALPRAZolam Prudy Feeler) 1 MG tablet Take 1 mg by mouth 4 (four) times daily as needed for anxiety.   Yes [provider]  citalopram (CELEXA) 40 MG tablet Take 40 mg by mouth daily.   Yes [provider]  estradiol (CLIMARA - DOSED IN MG/24 HR) 0.05 mg/24hr patch Place 1 patch onto the skin once a week.   Yes [provider]  etanercept (ENBREL) 25 MG injection Inject into the skin once a week.   Yes [provider]  oxyCODONE (ROXICODONE) 15 MG immediate release tablet Take 15 mg by mouth 4 (four) times daily.   Yes [provider]  benzonatate (TESSALON) 200 MG capsule Take 1 capsule (200 mg total) by mouth 3 (three) times daily. Patient not taking: Reported on 12/11/2016 11/05/16   Carla Baas, MD  doxycycline (VIBRAMYCIN) 100 MG capsule Take 1 capsule (100 mg total) by mouth 2 (  two) times daily. X 10 days Patient not taking: Reported on 12/11/2016 11/05/16   Carla Baas, MD  DULoxetine (CYMBALTA) 60 MG capsule Take 1 capsule (60 mg total) by mouth daily. Patient not taking: Reported on 12/11/2016 02/19/11   Carla Gal, MD  predniSONE (STERAPRED UNI-PAK 21 TAB) 10 MG (21) TBPK tablet 6 tabs PO x 1 day 5  tabs PO x 1 day 4 tabs PO x 1 day 3 tabs PO x 1 day 2 tabs PO x 1 day 1 tab PO x 1 day and stop Patient not taking: Reported on 12/11/2016 11/05/16   Carla Baas, MD  senna-docusate (SENOKOT-S) 8.6-50 MG tablet Take 1 tablet by mouth 2 (two) times daily. Patient not taking: Reported on 12/11/2016 11/05/16   Carla Baas, MD  traMADol (ULTRAM) 50 MG tablet Take 50 mg by mouth 2 (two) times daily.    [provider]  zolpidem (AMBIEN) 5 MG tablet Take 1 tablet (5 mg total) by mouth at bedtime as needed for sleep. Patient not taking: Reported on 12/11/2016 11/05/16   Carla Baas, MD    Allergies Aspirin; Meperidine hcl; Penicillins; and Sulfa antibiotics  Family History  Problem Relation Age of Onset  . Cancer Mother   . Arthritis Father   . Cancer Father   . Cancer Sister   . Heart disease Maternal Grandfather   . Stroke Paternal Grandfather     Social History Social History   Tobacco Use  . Smoking status: Never Smoker  . Smokeless tobacco: Never Used  Substance Use Topics  . Alcohol use: No  . Drug use: No    Review of Systems  Constitutional: Negative for fever. + confusion Eyes: Negative for visual changes. ENT: Negative for sore throat. Neck: No neck pain  Cardiovascular: Negative for chest pain. Respiratory: Negative for shortness of breath. Gastrointestinal: Negative for abdominal pain, vomiting or diarrhea. Genitourinary: Negative for dysuria. Musculoskeletal: Negative for back pain. Skin: Negative for rash. Neurological: Negative for headaches, weakness or numbness. Psych: No SI or HI. + visual hallucinations  ____________________________________________   PHYSICAL EXAM:  VITAL SIGNS: ED Triage Vitals  Enc Vitals Group     BP 12/11/16 1401 (!) 145/86     Pulse Rate 12/11/16 1401 (!) 102     Resp 12/11/16 1401 (!) 22     Temp 12/11/16 1401 99.6 F (37.6 C)     Temp Source 12/11/16 1401 Oral     SpO2 12/11/16 1401 98 %       Weight 12/11/16 1402 103 lb (46.7 kg)     Height 12/11/16 1402 5\' 5"  (1.651 m)     Head Circumference --      Peak Flow --      Pain Score --      Pain Loc --      Pain Edu? --      Excl. in GC? --     Constitutional: Alert and oriented x3, no distress. HEENT:      Head: Normocephalic and atraumatic.         Eyes: Conjunctivae are normal. Sclera is non-icteric.       Mouth/Throat: Mucous membranes are dry.       Neck: Supple with no signs of meningismus. Cardiovascular: Tachycardic with regular rhythm. No murmurs, gallops, or rubs. 2+ symmetrical distal pulses are present in all extremities. No JVD. Respiratory: Normal respiratory effort. Lungs are clear to auscultation bilaterally. No wheezes, crackles, or rhonchi.  Gastrointestinal: Soft, non tender, and non  distended with positive bowel sounds. No rebound or guarding. Musculoskeletal: Nontender with normal range of motion in all extremities. No edema, cyanosis, or erythema of extremities. Neurologic: Normal speech and language. Face is symmetric. Moving all extremities. Intact strength and sensation. EOMI and PERRL. No gross focal neurologic deficits are appreciated. Skin: Skin is warm, dry and intact. No rash noted. Psychiatric: Mood and affect are blunt.   ____________________________________________   LABS (all labs ordered are listed, but only abnormal results are displayed)  Labs Reviewed  BASIC METABOLIC PANEL - Abnormal; Notable for the following components:      Result Value   Sodium 132 (*)    Potassium 3.4 (*)    Chloride 94 (*)    Glucose, Bld 100 (*)    Calcium 8.7 (*)    Anion gap 16 (*)    All other components within normal limits  CBC - Abnormal; Notable for the following components:   Hemoglobin 11.2 (*)    HCT 34.3 (*)    RDW 16.9 (*)    Platelets 452 (*)    All other components within normal limits  URINALYSIS, COMPLETE (UACMP) WITH MICROSCOPIC - Abnormal; Notable for the following components:    Color, Urine YELLOW (*)    APPearance CLEAR (*)    Hgb urine dipstick SMALL (*)    Ketones, ur 5 (*)    Squamous Epithelial / LPF 0-5 (*)    All other components within normal limits  GLUCOSE, CAPILLARY - Abnormal; Notable for the following components:   Glucose-Capillary 107 (*)    All other components within normal limits  TSH - Abnormal; Notable for the following components:   TSH 0.183 (*)    All other components within normal limits  URINE DRUG SCREEN, QUALITATIVE ([facility] ONLY) - Abnormal; Notable for the following components:   Tricyclic, Ur Screen POSITIVE (*)    Benzodiazepine, Ur Scrn POSITIVE (*)    All other components within normal limits  T4, FREE - Abnormal; Notable for the following components:   Free T4 1.25 (*)    All other components within normal limits  CBC - Abnormal; Notable for the following components:   RBC 3.68 (*)    Hemoglobin 10.2 (*)    HCT 30.6 (*)    RDW 16.8 (*)    All other components within normal limits  BASIC METABOLIC PANEL - Abnormal; Notable for the following components:   Sodium 133 (*)    Potassium 2.9 (*)    Chloride 100 (*)    Glucose, Bld 104 (*)    Calcium 8.3 (*)    All other components within normal limits  MAGNESIUM - Abnormal; Notable for the following components:   Magnesium 1.5 (*)    All other components within normal limits  BASIC METABOLIC PANEL - Abnormal; Notable for the following components:   Sodium 133 (*)    BUN <5 (*)    Creatinine, Ser 0.42 (*)    Calcium 8.2 (*)    All other components within normal limits  MAGNESIUM - Abnormal; Notable for the following components:   Magnesium 1.5 (*)    All other components within normal limits  ETHANOL  TROPONIN I  T3, FREE  PHOSPHORUS  PHOSPHORUS  MAGNESIUM  CBG MONITORING, ED   ____________________________________________  EKG  ED ECG REPORT I, Nita Sickle, the attending physician, personally viewed and interpreted this ECG.  Sinus tachycardia, rate of  104, normal intervals, normal axis, no ST elevations or depressions.  ____________________________________________  RADIOLOGY  CXR: Persistent lung hyperinflation. No active cardiopulmonary disease.  Head CT: 1. No intracranial abnormality. 2. Dependent fluid seen in the right maxillary and both sphenoid sinuses. Consider acute sinusitis in the proper clinical setting. ____________________________________________   PROCEDURES  Procedure(s) performed: None Procedures Critical Care performed:  None ____________________________________________   INITIAL IMPRESSION / ASSESSMENT AND PLAN / ED COURSE  66 y.o. female with a history of collagen vascular disease, fibromyalgia, rheumatoid arthritis, anxiety, depression who presents for evaluation of 3 days of visual hallucinations and confusion. Patient is neurologically intact, no signs of trauma, vitals show slight tachycardia but afebrile. Physical exam with no acute findings. Broad etiology including intracranial abnormalities, infection, dehydration, electrolyte abnormalities. Will check labs, CXR, UA, head CT. Will give IVF as patient is tachycardic with slight AG acidosis.   ED COURSE:  Workup essentially unremarkable in the emergency department with no obvious etiology of patient's delirium. At this point patient will be admitted to the Hospitalist service for further evaluation.   As part of my medical decision making, I reviewed the following data within the electronic MEDICAL RECORD NUMBER History obtained from family, Nursing notes reviewed and incorporated, Labs reviewed , EKG interpreted , Old chart reviewed, Radiograph reviewed , Discussed with admitting physician , Notes from prior ED visits and [place] Controlled Substance Database    Pertinent labs & imaging results that were available during my care of the patient were reviewed by me and considered in my medical decision making (see chart for  details).    ____________________________________________   FINAL CLINICAL IMPRESSION(S) / ED DIAGNOSES  Final diagnoses:  Encephalopathy acute  Delirium  Visual hallucinations      NEW MEDICATIONS STARTED DURING THIS VISIT:  This SmartLink is deprecated. Use AVSMEDLIST instead to display the medication list for a patient.   Note:  This document was prepared using Dragon voice recognition software and may include unintentional dictation errors.    Nita Sickle, MD 12/14/16 [phone]

## 2016-12-11 NOTE — ED Notes (Signed)
Per pt sisters the pt has been mentally altered x3 days - pt thinks that people have broken into her house (no one was there) - pt sister reports that pt is seeing things and people that are not there - pt states that she is scared - pt appears paranoid (looking around room with wide eyes) - pt has recently been in the hospital for pneumonia - pt is oriented to person, place, and time - pt denies headache - denies vomiting and diarrhea - pt has had decreased intake and output today - Dr Don Perking at bedside

## 2016-12-11 NOTE — ED Notes (Signed)
Patient transported to X-ray 

## 2016-12-11 NOTE — ED Notes (Addendum)
Pt given drink and snack. Caregiver offered drink and snack.

## 2016-12-11 NOTE — ED Notes (Signed)
Apologized for long wait time. Family at side of patient, understanding. Pt alert and oriented X4, active, cooperative, pt in NAD. RR even and unlabored, color WNL.

## 2016-12-11 NOTE — ED Notes (Signed)
Pt assisted to toilet to void 

## 2016-12-12 ENCOUNTER — Inpatient Hospital Stay: Payer: Medicare Other

## 2016-12-12 DIAGNOSIS — F13239 Sedative, hypnotic or anxiolytic dependence with withdrawal, unspecified: Secondary | ICD-10-CM | POA: Diagnosis present

## 2016-12-12 DIAGNOSIS — T424X5A Adverse effect of benzodiazepines, initial encounter: Secondary | ICD-10-CM | POA: Diagnosis present

## 2016-12-12 DIAGNOSIS — E039 Hypothyroidism, unspecified: Secondary | ICD-10-CM | POA: Diagnosis present

## 2016-12-12 DIAGNOSIS — Z79891 Long term (current) use of opiate analgesic: Secondary | ICD-10-CM | POA: Diagnosis not present

## 2016-12-12 DIAGNOSIS — T43215A Adverse effect of selective serotonin and norepinephrine reuptake inhibitors, initial encounter: Secondary | ICD-10-CM | POA: Diagnosis present

## 2016-12-12 DIAGNOSIS — Z90710 Acquired absence of both cervix and uterus: Secondary | ICD-10-CM | POA: Diagnosis not present

## 2016-12-12 DIAGNOSIS — F23 Brief psychotic disorder: Secondary | ICD-10-CM | POA: Insufficient documentation

## 2016-12-12 DIAGNOSIS — E871 Hypo-osmolality and hyponatremia: Secondary | ICD-10-CM | POA: Diagnosis present

## 2016-12-12 DIAGNOSIS — J019 Acute sinusitis, unspecified: Secondary | ICD-10-CM | POA: Diagnosis present

## 2016-12-12 DIAGNOSIS — G894 Chronic pain syndrome: Secondary | ICD-10-CM | POA: Diagnosis present

## 2016-12-12 DIAGNOSIS — M069 Rheumatoid arthritis, unspecified: Secondary | ICD-10-CM | POA: Diagnosis present

## 2016-12-12 DIAGNOSIS — E878 Other disorders of electrolyte and fluid balance, not elsewhere classified: Secondary | ICD-10-CM | POA: Diagnosis present

## 2016-12-12 DIAGNOSIS — T426X5A Adverse effect of other antiepileptic and sedative-hypnotic drugs, initial encounter: Secondary | ICD-10-CM | POA: Diagnosis present

## 2016-12-12 DIAGNOSIS — E876 Hypokalemia: Secondary | ICD-10-CM | POA: Diagnosis present

## 2016-12-12 DIAGNOSIS — F329 Major depressive disorder, single episode, unspecified: Secondary | ICD-10-CM | POA: Diagnosis present

## 2016-12-12 DIAGNOSIS — T404X5A Adverse effect of other synthetic narcotics, initial encounter: Secondary | ICD-10-CM | POA: Diagnosis present

## 2016-12-12 DIAGNOSIS — R410 Disorientation, unspecified: Secondary | ICD-10-CM | POA: Diagnosis present

## 2016-12-12 DIAGNOSIS — E860 Dehydration: Secondary | ICD-10-CM | POA: Diagnosis present

## 2016-12-12 DIAGNOSIS — M797 Fibromyalgia: Secondary | ICD-10-CM | POA: Diagnosis present

## 2016-12-12 DIAGNOSIS — T43225A Adverse effect of selective serotonin reuptake inhibitors, initial encounter: Secondary | ICD-10-CM | POA: Diagnosis present

## 2016-12-12 DIAGNOSIS — Z79899 Other long term (current) drug therapy: Secondary | ICD-10-CM

## 2016-12-12 DIAGNOSIS — G92 Toxic encephalopathy: Secondary | ICD-10-CM | POA: Diagnosis present

## 2016-12-12 DIAGNOSIS — T402X5A Adverse effect of other opioids, initial encounter: Secondary | ICD-10-CM | POA: Diagnosis present

## 2016-12-12 DIAGNOSIS — Z9119 Patient's noncompliance with other medical treatment and regimen: Secondary | ICD-10-CM | POA: Diagnosis not present

## 2016-12-12 LAB — CBC
HCT: 30.6 % — ABNORMAL LOW (ref 35.0–47.0)
Hemoglobin: 10.2 g/dL — ABNORMAL LOW (ref 12.0–16.0)
MCH: 27.6 pg (ref 26.0–34.0)
MCHC: 33.2 g/dL (ref 32.0–36.0)
MCV: 83.3 fL (ref 80.0–100.0)
Platelets: 412 10*3/uL (ref 150–440)
RBC: 3.68 MIL/uL — AB (ref 3.80–5.20)
RDW: 16.8 % — ABNORMAL HIGH (ref 11.5–14.5)
WBC: 6.2 10*3/uL (ref 3.6–11.0)

## 2016-12-12 LAB — BASIC METABOLIC PANEL
ANION GAP: 8 (ref 5–15)
BUN: 8 mg/dL (ref 6–20)
CO2: 25 mmol/L (ref 22–32)
Calcium: 8.3 mg/dL — ABNORMAL LOW (ref 8.9–10.3)
Chloride: 100 mmol/L — ABNORMAL LOW (ref 101–111)
Creatinine, Ser: 0.52 mg/dL (ref 0.44–1.00)
GFR calc Af Amer: >60 mL/min (ref >60)
Glucose, Bld: 104 mg/dL — ABNORMAL HIGH (ref 65–99)
POTASSIUM: 2.9 mmol/L — AB (ref 3.5–5.1)
SODIUM: 133 mmol/L — AB (ref 135–145)

## 2016-12-12 LAB — MAGNESIUM: MAGNESIUM: 1.5 mg/dL — AB (ref 1.7–2.4)

## 2016-12-12 MED ORDER — RISPERIDONE 0.5 MG PO TBDP
0.5000 mg | ORAL_TABLET | Freq: Every day | ORAL | Status: DC
  Administered 2016-12-12 – 2016-12-14 (×3): 0.5 mg via ORAL
  Filled 2016-12-12 (×4): qty 1

## 2016-12-12 MED ORDER — ACETAMINOPHEN 650 MG RE SUPP: 650.0000 mg | Freq: Four times a day (QID) | RECTAL | Status: DC | PRN

## 2016-12-12 MED ORDER — ALPRAZOLAM 0.5 MG PO TABS: 0.5000 mg | ORAL_TABLET | Freq: Four times a day (QID) | ORAL | Status: DC | PRN

## 2016-12-12 MED ORDER — OXYCODONE HCL 5 MG PO TABS
15.0000 mg | ORAL_TABLET | Freq: Four times a day (QID) | ORAL | Status: DC
  Administered 2016-12-12 – 2016-12-14 (×9): 15 mg via ORAL
  Filled 2016-12-12 (×9): qty 3

## 2016-12-12 MED ORDER — AZITHROMYCIN 500 MG PO TABS
500.0000 mg | ORAL_TABLET | Freq: Every day | ORAL | Status: AC
  Administered 2016-12-12 – 2016-12-14 (×3): 500 mg via ORAL
  Filled 2016-12-12 (×3): qty 1

## 2016-12-12 MED ORDER — POTASSIUM CHLORIDE CRYS ER 20 MEQ PO TBCR: 20.0000 meq | EXTENDED_RELEASE_TABLET | Freq: Two times a day (BID) | ORAL | Status: DC

## 2016-12-12 MED ORDER — SENNOSIDES-DOCUSATE SODIUM 8.6-50 MG PO TABS
1.0000 | ORAL_TABLET | Freq: Two times a day (BID) | ORAL | Status: DC
  Administered 2016-12-12 – 2016-12-15 (×7): 1 via ORAL
  Filled 2016-12-12 (×7): qty 1

## 2016-12-12 MED ORDER — ALPRAZOLAM 1 MG PO TABS
1.0000 mg | ORAL_TABLET | Freq: Two times a day (BID) | ORAL | Status: DC
  Administered 2016-12-12 – 2016-12-14 (×4): 1 mg via ORAL
  Filled 2016-12-12 (×4): qty 1

## 2016-12-12 MED ORDER — SODIUM CHLORIDE 0.9% FLUSH
3.0000 mL | Freq: Two times a day (BID) | INTRAVENOUS | Status: DC
  Administered 2016-12-12 – 2016-12-15 (×3): 3 mL via INTRAVENOUS

## 2016-12-12 MED ORDER — ENOXAPARIN SODIUM 40 MG/0.4ML ~~LOC~~ SOLN
40.0000 mg | SUBCUTANEOUS | Status: DC
  Administered 2016-12-12: 40 mg via SUBCUTANEOUS
  Filled 2016-12-12: qty 0.4

## 2016-12-12 MED ORDER — ONDANSETRON HCL 4 MG/2ML IJ SOLN: 4.0000 mg | Freq: Four times a day (QID) | INTRAMUSCULAR | Status: DC | PRN

## 2016-12-12 MED ORDER — POLYETHYLENE GLYCOL 3350 17 G PO PACK: 17.0000 g | PACK | Freq: Every day | ORAL | Status: DC | PRN

## 2016-12-12 MED ORDER — POTASSIUM CHLORIDE IN NACL 20-0.9 MEQ/L-% IV SOLN
INTRAVENOUS | Status: DC
  Administered 2016-12-12 – 2016-12-14 (×5): via INTRAVENOUS
  Filled 2016-12-12 (×9): qty 1000

## 2016-12-12 MED ORDER — ONDANSETRON HCL 4 MG PO TABS: 4.0000 mg | ORAL_TABLET | Freq: Four times a day (QID) | ORAL | Status: DC | PRN

## 2016-12-12 MED ORDER — ENSURE ENLIVE PO LIQD
237.0000 mL | Freq: Three times a day (TID) | ORAL | Status: DC
  Administered 2016-12-12 – 2016-12-15 (×7): 237 mL via ORAL

## 2016-12-12 MED ORDER — ALBUTEROL SULFATE (2.5 MG/3ML) 0.083% IN NEBU: 3.0000 mL | INHALATION_SOLUTION | Freq: Four times a day (QID) | RESPIRATORY_TRACT | Status: DC | PRN

## 2016-12-12 MED ORDER — AMLODIPINE BESYLATE 10 MG PO TABS
10.0000 mg | ORAL_TABLET | Freq: Every day | ORAL | Status: DC
  Administered 2016-12-12 – 2016-12-14 (×3): 10 mg via ORAL
  Filled 2016-12-12 (×3): qty 1

## 2016-12-12 MED ORDER — POTASSIUM CHLORIDE 20 MEQ PO PACK
40.0000 meq | PACK | Freq: Once | ORAL | Status: AC
Start: 2016-12-12 — End: 2016-12-12
  Administered 2016-12-12: 40 meq via ORAL
  Filled 2016-12-12: qty 2

## 2016-12-12 MED ORDER — ACETAMINOPHEN 325 MG PO TABS: 650.0000 mg | ORAL_TABLET | Freq: Four times a day (QID) | ORAL | Status: DC | PRN

## 2016-12-12 MED ORDER — ADULT MULTIVITAMIN W/MINERALS CH
1.0000 | ORAL_TABLET | Freq: Every day | ORAL | Status: DC
  Administered 2016-12-12 – 2016-12-15 (×4): 1 via ORAL
  Filled 2016-12-12 (×4): qty 1

## 2016-12-12 NOTE — Consult Note (Signed)
Reason for Consult: Rheumatoid arthritis  Referring Physician: Hospitalist  Campbell Stall   HPI: 66 year old white female.  History of rheumatoid arthritis.  Long-standing.  She is previously taken Plaquenil methotrexate Cimzia.  Currently managing [place].  Currently on Enbrel. Chronic pain syndrome.  Has been on chronic narcotics According to the family she usually ambulates without cane or walker.  She has not had any joint procedures Pneumonia and in October.  Had antibiotics.  Was discharged home.  In the last couple days she has had confusion.  Hallucinations.  She was admitted with acute encephalopathy.  MRI negative.  White count normal.  No definite fever.  She has not recognized her family.  Has been managing her home meds.  Tox screen was positive for try cyclic and benzodiazepines.  Thyroid mildly elevated with low TSH.  Consult to try to maximize rheumatoid management According to the family she is Carla Wilson is a variety of places.  She is not been having significant hand swelling.  Does not use cane or walker.  Occasionally has taken prednisone.  On admission she has mildly hyponatremia and hypokalemia.  Normal creatinine.  Normal white count.  TSH 1.183. Patient cannot give a history as to her recent joint complaints.  PMH: Rheumatoid arthritis.  Chronic pain syndrome.  SURGICAL HISTORY: No joint surgeries  Family History: Daughter has rheumatoid  Social History: No significant cigarettes or alcohol  Allergies:  Allergies  Allergen Reactions  . Aspirin Other (See Comments)    Upset stomach  . Meperidine Hcl Nausea And Vomiting  . Penicillins Swelling    Has patient had a PCN reaction causing immediate rash, facial/tongue/throat swelling, SOB or lightheadedness with hypotension: Yes Has patient had a PCN reaction causing severe rash involving mucus membranes or skin necrosis: No Has patient had a PCN reaction that required hospitalization: No Has patient had a PCN  reaction occurring within the last 10 years: No If all of the above answers are "NO", then may proceed with Cephalosporin use.  . Sulfa Antibiotics Nausea Only    Medications:  Scheduled: . amLODipine  10 mg Oral Daily  . azithromycin  500 mg Oral Daily  . enoxaparin (LOVENOX) injection  40 mg Subcutaneous Q24H  . multivitamin with minerals  1 tablet Oral Daily  . oxyCODONE  15 mg Oral QID  . senna-docusate  1 tablet Oral BID  . sodium chloride flush  3 mL Intravenous Q12H   Continuous: . 0.9 % NaCl with KCl 20 mEq / L 125 mL/hr at 12/12/16 1321        ROS: Unreliable.  According to family no recent nausea vomiting diarrhea.  No chest pain.   PHYSICAL EXAM: Blood pressure (!) 142/88, pulse (!) 103, temperature 98.2 F (36.8 C), temperature source Oral, resp. rate (!) 24, height 5\' 5"  (1.651 m), weight 46.7 kg (103 lb), SpO2 95 %. Ill Female.  Thin.  Does not answer questions appropriately.  Resists much examination.  Sits with her eyes tightly closed but will open them with some conversation.  Oropharynx is clear.  Clear chest.  Regular rhythm.  Trace edema.  Skeletal: Decreased range of motion both shoulders.  Elbows without nodules.  Decreased range of motion the wrist.  Subluxation of ulnar drift across the MCPs but no significant active synovitis.  Hips move reasonably.  Does not allow bending of either knee left knee has small effusion.  Ankles are flexed.  Prominent hallux valgus with MTP subluxation neurologic: Cranial nerves intact, rigidity in  the lower extremities.  No definite clonus.  Moves all extremities.  Hallucinating during examination her family members in the room  Assessment: Rheumatoid arthritis, on current biologic drug embolus status post other remittive agents with deformities.  Long-standing Acute psychosis.  Being evaluated.  Multiple concern about recent noncompliance Hypothyroid SPECT shoulder and knee involvement of her  rheumatoid  Recommendations: Given her psychosis would not use steroids.  Once her mental status clears ,we can better assess whether she might benefit by injections into her knees and shoulders.  She has a rheumatologist in [place] and can follow-up with them, unless she would prefer outpatient treatment in [place].  When mental status is improved, consider physical therapy.  Would not give Enbrel during this acute psychosis.  Trevor Iha W 12/12/2016, 4:29 PM

## 2016-12-12 NOTE — ED Notes (Addendum)
Patient found attempting to sit up on the edge of the bed.  Due to patient being a high fall risk her with confusion, a bed alarm was placed on patient's bed.  Patient able to be redirected back to laying position at this time. Yellow socks and armband placed on patient at this time.
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
## 2016-12-12 NOTE — H&P (Signed)
[facility] [place] [facility]   PATIENT NAME: Carla Wilson Gethers    MR#:  [id number]  DATE OF BIRTH:  1950/10/06  DATE OF ADMISSION:  12/11/2016  PRIMARY CARE PHYSICIAN: System, Pcp Not In   REQUESTING/REFERRING PHYSICIAN:   CHIEF COMPLAINT:   Chief Complaint  Patient presents with  . Dizziness  . Hallucinations    HISTORY OF PRESENT ILLNESS: Carla Wilson  is a 66 y.o. female with a known history per below presenting with visual and auditory hallucinations for the last 3 days, dizziness, confusion per ED attending and discussion with the patient's family members, ER workup noted for sodium 132, potassium 3.4, chloride 94, anion gap 16, TSH low, T4 slightly elevated, urinalysis positive for ketones, urine drug screen positive for benzodiazepines/try cyclic's, CT head noted for right maxillary as well as sphenoid sinusitis, EKG with sinus tachycardia heart rate 104, patient evaluated in the emergency room, patient is a poor historian, patient thinks that she is in a tree, no family available at this time to give further information, patient appears anxious, patient denies pain.  Patient is now being admitted for acute psychosis, acute dehydration, acute sinusitis, and hyperthyroidism.  PAST MEDICAL HISTORY:   Past Medical History:  Diagnosis Date  . Anxiety   . Collagen vascular disease (HCC)   . Depression   . Fibromyalgia   . Pulmonary disease    NOS  . RA (rheumatoid arthritis) (HCC)   . Rheumatoid arthritis (HCC)     PAST SURGICAL HISTORY:  Past Surgical History:  Procedure Laterality Date  . ABDOMINAL HYSTERECTOMY    . CESAREAN SECTION      SOCIAL HISTORY:  Social History   Tobacco Use  . Smoking status: Never Smoker  . Smokeless tobacco: Never Used  Substance Use Topics  . Alcohol use: No    FAMILY HISTORY:  Family History  Problem Relation Age of Onset  . Cancer Mother   . Arthritis Father   . Cancer Father   . Cancer Sister   . Heart  disease Maternal Grandfather   . Stroke Paternal Grandfather     DRUG ALLERGIES:  Allergies  Allergen Reactions  . Aspirin Other (See Comments)    Upset stomach  . Meperidine Hcl Nausea And Vomiting  . Penicillins Swelling    Has patient had a PCN reaction causing immediate rash, facial/tongue/throat swelling, SOB or lightheadedness with hypotension: Yes Has patient had a PCN reaction causing severe rash involving mucus membranes or skin necrosis: No Has patient had a PCN reaction that required hospitalization: No Has patient had a PCN reaction occurring within the last 10 years: No If all of the above answers are "NO", then may proceed with Cephalosporin use.  . Sulfa Antibiotics Nausea Only    REVIEW OF SYSTEMS: Poor historian due to acute psychosis/unable to obtain review of systems  MEDICATIONS AT HOME:  Prior to Admission medications   Medication Sig Start Date End Date Taking? Authorizing Provider  albuterol (PROVENTIL HFA;VENTOLIN HFA) 108 (90 Base) MCG/ACT inhaler Inhale 1-2 puffs into the lungs every 6 (six) hours as needed for wheezing or shortness of breath.   Yes [provider]  ALPRAZolam Prudy Feeler(XANAX) 1 MG tablet Take 1 mg by mouth 4 (four) times daily as needed for anxiety.   Yes [provider]  citalopram (CELEXA) 40 MG tablet Take 40 mg by mouth daily.   Yes [provider]  estradiol (CLIMARA - DOSED IN MG/24 HR) 0.05 mg/24hr patch Place 1  patch onto the skin once a week.   Yes [provider]  etanercept (ENBREL) 25 MG injection Inject into the skin once a week.   Yes [provider]  oxyCODONE (ROXICODONE) 15 MG immediate release tablet Take 15 mg by mouth 4 (four) times daily.   Yes [provider]  benzonatate (TESSALON) 200 MG capsule Take 1 capsule (200 mg total) by mouth 3 (three) times daily. Patient not taking: Reported on 12/11/2016 11/05/16   [name], MD  doxycycline (VIBRAMYCIN) 100 MG capsule  Take 1 capsule (100 mg total) by mouth 2 (two) times daily. X 10 days Patient not taking: Reported on 12/11/2016 11/05/16   [name], MD  DULoxetine (CYMBALTA) 60 MG capsule Take 1 capsule (60 mg total) by mouth daily. Patient not taking: Reported on 12/11/2016 02/19/11   [name], MD  predniSONE (STERAPRED UNI-PAK 21 TAB) 10 MG (21) TBPK tablet 6 tabs PO x 1 day 5 tabs PO x 1 day 4 tabs PO x 1 day 3 tabs PO x 1 day 2 tabs PO x 1 day 1 tab PO x 1 day and stop Patient not taking: Reported on 12/11/2016 11/05/16   [name], MD  senna-docusate (SENOKOT-S) 8.6-50 MG tablet Take 1 tablet by mouth 2 (two) times daily. Patient not taking: Reported on 12/11/2016 11/05/16   [name], MD  traMADol (ULTRAM) 50 MG tablet Take 50 mg by mouth 2 (two) times daily.    [provider]  zolpidem (AMBIEN) 5 MG tablet Take 1 tablet (5 mg total) by mouth at bedtime as needed for sleep. Patient not taking: Reported on 12/11/2016 11/05/16   [name], MD      PHYSICAL EXAMINATION:   VITAL SIGNS: Blood pressure (!) 168/97, pulse (!) 101, temperature 98.7 F (37.1 C), temperature source Oral, resp. rate 17, height 5\' 5"  (1.651 m), weight 46.7 kg (103 lb), SpO2 95 %.  GENERAL:  66 y.o.-year-old patient lying in the bed with no acute distress.  Frail appearing, nontoxic appearing EYES: Pupils equal, round, reactive to light and accommodation. No scleral icterus. Extraocular muscles intact.  HEENT: Head atraumatic, normocephalic. Oropharynx and nasopharynx clear.  Dry mucous membranes NECK:  Supple, no jugular venous distention. No thyroid enlargement, no tenderness.  Poor skin turgor LUNGS: Normal breath sounds bilaterally, no wheezing, rales,rhonchi or crepitation. No use of accessory muscles of respiration.  CARDIOVASCULAR: S1, S2 normal. No murmurs, rubs, or gallops.  ABDOMEN: Soft, nontender, nondistended. Bowel sounds present. No organomegaly or mass.   EXTREMITIES: No pedal edema, cyanosis, or clubbing.  NEUROLOGIC: Cranial nerves II through XII are intact. Muscle strength 5/5 in all extremities. Gait not checked.  PSYCHIATRIC: The patient is alert and oriented x 2, confused, disoriented, positive auditory and visual hallucinations.  SKIN: No obvious rash, lesion, or ulcer.   LABORATORY PANEL:   CBC Recent Labs  Lab 12/11/16 1408  WBC 8.0  HGB 11.2*  HCT 34.3*  PLT 452*  MCV 82.9  MCH 26.9  MCHC 32.5  RDW 16.9*   ------------------------------------------------------------------------------------------------------------------  Chemistries  Recent Labs  Lab 12/11/16 1408  NA 132*  K 3.4*  CL 94*  CO2 22  GLUCOSE 100*  BUN 9  CREATININE 0.54  CALCIUM 8.7*   ------------------------------------------------------------------------------------------------------------------ estimated creatinine clearance is 51.7 mL/min (by C-G formula based on SCr of 0.54 mg/dL). ------------------------------------------------------------------------------------------------------------------ Recent Labs    12/11/16 1959  TSH 0.183*     Coagulation profile No results for input(s): INR, PROTIME in the last  168 hours. ------------------------------------------------------------------------------------------------------------------- No results for input(s): DDIMER in the last 72 hours. -------------------------------------------------------------------------------------------------------------------  Cardiac Enzymes Recent Labs  Lab 12/11/16 1959  TROPONINI <0.03   ------------------------------------------------------------------------------------------------------------------ Invalid input(s): POCBNP  ---------------------------------------------------------------------------------------------------------------  Urinalysis    Component Value Date/Time   COLORURINE YELLOW (A) 12/11/2016 2110   APPEARANCEUR CLEAR (A)  12/11/2016 2110   LABSPEC 1.010 12/11/2016 2110   PHURINE 6.0 12/11/2016 2110   GLUCOSEU NEGATIVE 12/11/2016 2110   HGBUR SMALL (A) 12/11/2016 2110   BILIRUBINUR NEGATIVE 12/11/2016 2110   KETONESUR 5 (A) 12/11/2016 2110   PROTEINUR NEGATIVE 12/11/2016 2110   UROBILINOGEN 1.0 09/18/2011 2351   NITRITE NEGATIVE 12/11/2016 2110   LEUKOCYTESUR NEGATIVE 12/11/2016 2110     RADIOLOGY: Dg Chest 2 View  Result Date: 12/11/2016 CLINICAL DATA:  Altered mental status. EXAM: CHEST  2 VIEW COMPARISON:  CT chest and chest x-ray dated November 03, 2016. FINDINGS: The cardiomediastinal silhouette is normal in size. Normal pulmonary vascularity. Atherosclerotic calcification of the aortic arch. Lungs remain hyperinflated. No focal consolidation, pleural effusion, or pneumothorax. No acute osseous abnormality. IMPRESSION: Persistent lung hyperinflation.  No active cardiopulmonary disease. Electronically Signed   By: Obie Dredge M.D.   On: 12/11/2016 20:32   Ct Head Wo Contrast  Result Date: 12/11/2016 CLINICAL DATA:  Pt states that she has been feeling dizzy for last 3 days. Pt family reports confusion for last 3 days. Pt states that she has been feeling dizzy for last 3 days. Pt family reports confusion for last 3 days. EXAM: CT HEAD WITHOUT CONTRAST TECHNIQUE: Contiguous axial images were obtained from the base of the skull through the vertex without intravenous contrast. COMPARISON:  04/07/2011 FINDINGS: Brain: No evidence of acute infarction, hemorrhage, hydrocephalus, extra-axial collection or mass lesion/mass effect. Vascular: No hyperdense vessel or unexpected calcification. Skull: Normal. Negative for fracture or focal lesion. Sinuses/Orbits: Globes orbits are unremarkable. Small amount of dependent fluid is seen in the right maxillary sinus and both sphenoid sinuses. Remaining sinuses are clear. Clear mastoid air cells. Other: None. IMPRESSION: 1. No intracranial abnormality. 2. Dependent fluid seen  in the right maxillary and both sphenoid sinuses. Consider acute sinusitis in the proper clinical setting. Electronically Signed   By: Amie Portland M.D.   On: 12/11/2016 20:41    EKG: Orders placed or performed during the hospital encounter of 12/11/16  . ED EKG  . ED EKG    IMPRESSION AND PLAN: 1 acute psychosis Exact etiology is unknown Suspect due to multifactorial process which includes polypharmacy as patient is on numerous psychotropic meds (Xanax, Celexa, opioid medications, Cymbalta, steroids, tramadol, Ambien) and acute probable sinusitis Admit to regular nursing floor bed, neurochecks per routine, hold psychotropic meds, treatment of acute sinusitis with Z-Pak, IV fluids for rehydration, fall/aspiration precautions, check ammonia level, check RPR, check MRI of the brain for further evaluation, consult psychiatry for expert opinion  2 acute probable sinusitis Z-Pak CT head noted  3 acute hyperthyroidism, unspecified TSH 0.183, T4 1.25 Check free T3 level, thyroid ultrasound for further investigation  4 acute dehydration IV fluids for rehydration  5 acute hyponatremia/hypokalemia/hypochloremia Replete with IV fluids and check BMP in the morning  6 chronic rheumatoid arthritis With associated chronic pain syndrome Continue oxycodone for now Hold steroids as these can also cause encephalopathy  Full code Condition stable Prognosis fair DVT prophylaxis with Lovenox subcu Disposition Home in 2-3 days  All the records are reviewed and case discussed with ED provider. Management plans discussed with the patient, family and  they are in agreement.  CODE STATUS: Code Status History    Date Active Date Inactive Code Status Order ID Comments User Context   11/04/2016 00:34 11/05/2016 19:18 Full Code [id number]  Oralia Manis, MD Inpatient   09/18/2011 23:50 09/19/2011 16:33 Full Code [id number]  Raeford Razor, MD ED       TOTAL TIME TAKING CARE OF THIS PATIENT: 40 minutes.     Evelena Asa Salary M.D on 12/12/2016   Between 7am to 6pm - Pager - [phone]  After 6pm go to [email or web address] - password [email or web address]  [facility]  Office  [phone]  CC: Primary care physician; System, Pcp Not In   Note: This dictation was prepared with Dragon dictation along with smaller phrase technology. Any transcriptional errors that result from this process are unintentional.

## 2016-12-12 NOTE — ED Notes (Signed)
Report called to floor. RN will call back once a bed closer to nurses station is available.

## 2016-12-12 NOTE — Progress Notes (Signed)
Notified Dr. Esaw Grandchild of b/p and inability to swallow PO potassium. Awaiting new orders at this time.

## 2016-12-12 NOTE — Progress Notes (Addendum)
Initial Nutrition Assessment  DOCUMENTATION CODES:   Severe malnutrition in context of social or environmental circumstances, Underweight  INTERVENTION:  Provide Ensure Enlive po TID, each supplement provides 350 kcal and 20 grams of protein.  Monitor magnesium, potassium, and phosphorus daily for at least 3 days, MD to replete as needed, as pt is at risk for refeeding syndrome given severe malnutrition.  Provide multivitamin with minerals daily.  NUTRITION DIAGNOSIS:   Severe Malnutrition related to social / environmental circumstances(anxiety, depression, loss of daughter, inadequate oral intake) as evidenced by severe fat depletion, severe muscle depletion.  GOAL:   Patient will meet greater than or equal to 90% of their needs  MONITOR:   PO intake, Supplement acceptance, Labs, Weight trends, I & O's  REASON FOR ASSESSMENT:   Malnutrition Screening Tool, Other (Comment)(low BMI)    ASSESSMENT:   66 year old female with PMHx of anxiety, depression, RA, fibromyalgia brought in by family due to hallucinations and admitted with acute psychosis, acute probable sinusitis, acute hyperthyroidism.   Met with patient, her sister, and her niece at bedside. Patient also has a sitter present. Patient sitting up in bed eating her lunch. Her sister reports this is the most she has eaten in a while now (though per chart she only finished 15% of meal). Patient very confused and unable to provide history. She is talking about chickens pecking her stomach during assessment. Her sister reports that she had PNA in October, but really has not been eating well since even before that. Patient's daughter passed a way a while ago and she has not eaten well since. Family brings her food, but she puts it in the fridge and usually does not eat it as far as they know. She loves [employer] burgers, so if they bring her one of those she will eat some, but is only able to finish 25% of that. Patient enjoys  Ensure.  Family reports her UBW was 160-170 lbs. Only recent weight in chart was from last month. RD obtained bed scale weight of 110.7 lbs. Family believes she has lost her weight over the past year. That would be a weight loss of at least 49.3 lbs (30.8% body weight) over 1 year, which is significant for time frame.  Meal Completion: 15% of lunch today per chart  Medications reviewed and include: azithromycin, MVI daily, senna, NS with KCl 20 mEq/L @ 125 mL/hr.  Labs reviewed: Sodium 133, Potassium 2.9, Chloride 100, Magnesium 1.5.  NUTRITION - FOCUSED PHYSICAL EXAM:    Most Recent Value  Orbital Region  Severe depletion  Upper Arm Region  Severe depletion  Thoracic and Lumbar Region  Severe depletion  Buccal Region  Severe depletion  Temple Region  Severe depletion  Clavicle Bone Region  Severe depletion  Clavicle and Acromion Bone Region  Severe depletion  Scapular Bone Region  Severe depletion  Dorsal Hand  Severe depletion  Patellar Region  Severe depletion  Anterior Thigh Region  Severe depletion  Posterior Calf Region  Severe depletion  Edema (RD Assessment)  None  Hair  Reviewed  Eyes  Reviewed  Mouth  Reviewed [edentulous,  per family patient does not have her denture here and does not wear them]  Skin  Reviewed  Nails  Reviewed     Diet Order:  DIET DYS 3 Room service appropriate? Yes; Fluid consistency: Thin  EDUCATION NEEDS:   Not appropriate for education at this time  Skin:  Skin Assessment: Reviewed RN Assessment  Last BM:  12/10/2016  Height:   Ht Readings from Last 1 Encounters:  12/11/16 '5\' 5"'  (1.651 m)    Weight:   Wt Readings from Last 1 Encounters:  12/12/16 110 lb 11.2 oz (50.2 kg)    Ideal Body Weight:  56.8 kg  BMI:  Body mass index is 18.42 kg/m.  Estimated Nutritional Needs:   Kcal:  1500-1760 (30-35 kcal/kg)  Protein:  75-85 grams (1.5-1.7 grams/kg)  Fluid:  1.5-1.7 L/day (30-35 mL/kg)  Willey Blade, MS, RD,  LDN Office: [phone] Pager: [phone] After Hours/Weekend Pager: [phone]

## 2016-12-12 NOTE — Progress Notes (Signed)
[facility] [place] [facility]   PATIENT NAME: Carla Wilson    MR#:  [id number]  DATE OF BIRTH:  1950-04-20  SUBJECTIVE:  CHIEF COMPLAINT:   Chief Complaint  Patient presents with  . Dizziness  . Hallucinations   Brought by family due to hallucinations. She has chronic psychiatric history and her compliance to her medications is questionable. As per her sisters were present in the room, she lives with her grandson who is also" a drug addict" as per them. And they are not sure if patient is taking her medicine on her own over the giving it to them. She is noted to have some electrolyte imbalance and some signs of sinusitis so admitted to medical services. During my visit patient is completely confused and keeps talking while looking at the wall to some imaginary people and was also seeing some feathers flying in the room. As per her sisters she was recently given and filled up oxycodone 112 tablets, 10-15 days ago and there are very few tablets left in her bottle so they were suspecting she may have overdosed on that. But her urine drug screen is negative for cough pured so she may not have taken any.  REVIEW OF SYSTEMS:   Because of confusion and hallucination , patient is not able to give review of system.  ROS  DRUG ALLERGIES:   Allergies  Allergen Reactions  . Aspirin Other (See Comments)    Upset stomach  . Meperidine Hcl Nausea And Vomiting  . Penicillins Swelling    Has patient had a PCN reaction causing immediate rash, facial/tongue/throat swelling, SOB or lightheadedness with hypotension: Yes Has patient had a PCN reaction causing severe rash involving mucus membranes or skin necrosis: No Has patient had a PCN reaction that required hospitalization: No Has patient had a PCN reaction occurring within the last 10 years: No If all of the above answers are "NO", then may proceed with Cephalosporin use.  . Sulfa Antibiotics Nausea Only    VITALS:   Blood pressure (!) 142/88, pulse (!) 103, temperature 98.2 F (36.8 C), temperature source Oral, resp. rate (!) 24, height 5\' 5"  (1.651 m), weight 46.7 kg (103 lb), SpO2 95 %.  PHYSICAL EXAMINATION:   GENERAL:  66 y.o.-year-old patient lying in the bed with no acute distress.  Frail appearing, nontoxic appearing EYES: Pupils equal, round, reactive to light and accommodation. No scleral icterus. Extraocular muscles intact.  HEENT: Head atraumatic, normocephalic. Oropharynx and nasopharynx clear.  Dry mucous membranes NECK:  Supple, no jugular venous distention. No thyroid enlargement, no tenderness.  Poor skin turgor LUNGS: Normal breath sounds bilaterally, no wheezing, rales,rhonchi or crepitation. No use of accessory muscles of respiration.  CARDIOVASCULAR: S1, S2 normal. No murmurs, rubs, or gallops.  ABDOMEN: Soft, nontender, nondistended. Bowel sounds present. No organomegaly or mass.  EXTREMITIES: No pedal edema, cyanosis, or clubbing. Severe deformities on both hands due to RA. NEUROLOGIC: Cranial nerves II through XII are intact. Muscle strength 4/5 in all extremities. Gait not checked.  PSYCHIATRIC: The patient is alert and oriented x 0, confused, disoriented, positive auditory and visual hallucinations. Keep talking constantly to imaginary people. SKIN: No obvious rash, lesion, or ulcer.    Physical Exam LABORATORY PANEL:   CBC Recent Labs  Lab 12/12/16 0505  WBC 6.2  HGB 10.2*  HCT 30.6*  PLT 412   ------------------------------------------------------------------------------------------------------------------  Chemistries  Recent Labs  Lab 12/12/16 0505  NA 133*  K 2.9*  CL 100*  CO2 25  GLUCOSE 104*  BUN 8  CREATININE 0.52  CALCIUM 8.3*  MG 1.5*   ------------------------------------------------------------------------------------------------------------------  Cardiac Enzymes Recent Labs  Lab 12/11/16 1959  TROPONINI <0.03    ------------------------------------------------------------------------------------------------------------------  RADIOLOGY:  Dg Chest 2 View  Result Date: 12/11/2016 CLINICAL DATA:  Altered mental status. EXAM: CHEST  2 VIEW COMPARISON:  CT chest and chest x-ray dated November 03, 2016. FINDINGS: The cardiomediastinal silhouette is normal in size. Normal pulmonary vascularity. Atherosclerotic calcification of the aortic arch. Lungs remain hyperinflated. No focal consolidation, pleural effusion, or pneumothorax. No acute osseous abnormality. IMPRESSION: Persistent lung hyperinflation.  No active cardiopulmonary disease. Electronically Signed   By: Obie Dredge M.D.   On: 12/11/2016 20:32   Ct Head Wo Contrast  Result Date: 12/11/2016 CLINICAL DATA:  Pt states that she has been feeling dizzy for last 3 days. Pt family reports confusion for last 3 days. Pt states that she has been feeling dizzy for last 3 days. Pt family reports confusion for last 3 days. EXAM: CT HEAD WITHOUT CONTRAST TECHNIQUE: Contiguous axial images were obtained from the base of the skull through the vertex without intravenous contrast. COMPARISON:  04/07/2011 FINDINGS: Brain: No evidence of acute infarction, hemorrhage, hydrocephalus, extra-axial collection or mass lesion/mass effect. Vascular: No hyperdense vessel or unexpected calcification. Skull: Normal. Negative for fracture or focal lesion. Sinuses/Orbits: Globes orbits are unremarkable. Small amount of dependent fluid is seen in the right maxillary sinus and both sphenoid sinuses. Remaining sinuses are clear. Clear mastoid air cells. Other: None. IMPRESSION: 1. No intracranial abnormality. 2. Dependent fluid seen in the right maxillary and both sphenoid sinuses. Consider acute sinusitis in the proper clinical setting. Electronically Signed   By: Amie Portland M.D.   On: 12/11/2016 20:41   Mr Brain Wo Contrast  Result Date: 12/12/2016 CLINICAL DATA:  Memory loss  EXAM: MRI HEAD WITHOUT CONTRAST TECHNIQUE: Multiplanar, multiecho pulse sequences of the brain and surrounding structures were obtained without intravenous contrast. COMPARISON:  Head CT 12/11/2016 FINDINGS: Brain: The midline structures are normal. There is no acute infarct or acute hemorrhage. No mass lesion, hydrocephalus, dural abnormality or extra-axial collection. Minimal periventricular white matter hyperintensity, not unexpected for age. No age-advanced or lobar predominant atrophy. No chronic microhemorrhage or superficial siderosis. Vascular: Major intracranial arterial and venous sinus flow voids are preserved. Skull and upper cervical spine: The visualized skull base, calvarium, upper cervical spine and extracranial soft tissues are normal. Sinuses/Orbits: No fluid levels or advanced mucosal thickening. No mastoid or middle ear effusion. Normal orbits. IMPRESSION: 1. No acute abnormality. 2. Normal appearance of the aging brain without age advanced or lobar predominant atrophy. Electronically Signed   By: Deatra Robinson M.D.   On: 12/12/2016 13:26   US Thyroid  Result Date: 12/12/2016 CLINICAL DATA:  Multinodular thyroid, previous right thyroid dominant nodule biopsy 04/27/2006 hypothyroidism EXAM: THYROID ULTRASOUND TECHNIQUE: Ultrasound examination of the thyroid gland and adjacent soft tissues was performed. COMPARISON:  04/02/2006, 12/07/2006 FINDINGS: Parenchymal Echotexture: Moderately heterogenous Isthmus: 2 mm Right lobe: 5.7 x 1.8 x 2.7 cm, previously 4.8 x 1.8 x 2.4 cm Left lobe: 4.1 x 1.5 x 1.8 cm, previously 4.7 x 1.6 x 2.2 cm _________________________________________________________ Estimated total number of nodules >/= 1 cm: 6-10 Number of spongiform nodules >/=  2 cm not described below (TR1): 0 Number of mixed cystic and solid nodules >/= 1.5 cm not described below (TR2): 3 _________________________________________________________ Nodule # 3: Location: Right; Mid Maximum size: 3.1,  previously 2.5 cm; Other  2 dimensions: 2.1 x 1.7, previously 1.9 x 1.2 cm Composition: solid/almost completely solid (2) Echogenicity: isoechoic (1) Shape: not taller-than-wide (0) Margins: ill-defined (0) Echogenic foci: none (0) ACR TI-RADS total points: 3. ACR TI-RADS risk category: TR3 (3 points). ACR TI-RADS recommendations: **Given size (>/= 2.5 cm) and appearance, fine needle aspiration of this mildly suspicious nodule should be considered based on TI-RADS criteria. _________________________________________________________ Nodule # 6: Location: Left; Superior Maximum size: 1.7, previously 1.0 cm; Other 2 dimensions: 1.4 x 1.0, previously 0.9 x 0.7 cm Composition: solid/almost completely solid (2) Echogenicity: isoechoic (1) Shape: not taller-than-wide (0) Margins: smooth (0) Echogenic foci: none (0) ACR TI-RADS total points: 3. ACR TI-RADS risk category: TR3 (3 points). ACR TI-RADS recommendations: *Given size (>/= 1.5 - 2.4 cm) and appearance, a follow-up ultrasound in 1 year should be considered based on TI-RADS criteria. _________________________________________________________ Nodule # 7: Location: Left; Mid Maximum size: 1.2, previously 0.7 cm; Other 2 dimensions: 1.0 x 0.6, previously 0.7 x 0.4 cm Composition: solid/almost completely solid (2) Echogenicity: hypoechoic (2) Shape: not taller-than-wide (0) Margins: smooth (0) Echogenic foci: none (0) ACR TI-RADS total points: 4. ACR TI-RADS risk category: TR4 (4-6 points). ACR TI-RADS recommendations: *Given size (>/= 1 - 1.4 cm) and appearance, a follow-up ultrasound in 1 year should be considered based on TI-RADS criteria. _________________________________________________________ There are additional bilateral mixed cystic and solid nodules as well less subcentimeter hypoechoic nodules which are not fully described by TI RADS criteria IMPRESSION: 3.1 cm right mid thyroid TR 3 nodule meets criteria for biopsy as above. Additional left TR 3 and TR 4  nodules as above meet criteria for follow-up in 1 year. The above is in keeping with the ACR TI-RADS recommendations - J Am Coll Radiol 2017;14:587-595. Electronically Signed   By: M.  Shick M.D.  Judie Petit On: 12/12/2016 13:41    ASSESSMENT AND PLAN:   Active Problems:   Acute psychosis (HCC)  1 acute psychosis Exact etiology is unknown Suspect due to multifactorial process which includes polypharmacy as patient is on numerous psychotropic meds (Xanax, Celexa, opioid medications, Cymbalta, steroids, tramadol, Ambien) and acute probable sinusitis Her compliance to her medication is questionable, as per her sister's- the other members that family might be using her medications as her urine is negative for all. Otherwise she has almost empty bottle of recently filled oxycodone. neurochecks per routine, hold psychotropic meds, treatment of acute sinusitis with Z-Pak, IV fluids for rehydration, fall/aspiration precautions, check ammonia level, check RPR, check MRI of the brain for further evaluation, consult psychiatry for expert opinion As she was on Xanax, I would like to add small dose of Xanax to avoid withdrawal symptoms.  2 acute probable sinusitis Z-Pak CT head noted  3 acute hyperthyroidism, unspecified TSH 0.183, T4 1.25 Check free T3 level, thyroid ultrasound for further investigation  4 acute dehydration IV fluids for rehydration  5 acute hyponatremia/hypokalemia/hypochloremia Replete with IV fluids and check BMP in the morning  6 chronic rheumatoid arthritis With associated chronic pain syndrome Continue oxycodone for now Hold steroids as these can also cause encephalopathy On family request, call hematology consult has they are concerned for flareup of RA.  All the records are reviewed and case discussed with Care Management/Social Workerr. Management plans discussed with the patient, family and they are in agreement.  CODE STATUS: Full.  TOTAL TIME TAKING CARE OF THIS  PATIENT: 35 minutes.     POSSIBLE D/C IN 1-2 DAYS, DEPENDING ON CLINICAL CONDITION.   Altamese DillingVACHHANI, Nyzier Boivin M.D on  12/12/2016   Between 7am to 6pm - Pager - [phone]  After 6pm go to [email or web address] - password [email or web address]  [facility]  Office  [phone]  CC: Primary care physician; System, Pcp Not In  Note: This dictation was prepared with Dragon dictation along with smaller phrase technology. Any transcriptional errors that result from this process are unintentional.
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
## 2016-12-12 NOTE — ED Notes (Signed)
Admission MD at bedside.  

## 2016-12-12 NOTE — ED Notes (Signed)
Pt transport to 149 

## 2016-12-13 LAB — T3, FREE: T3 FREE: 3.2 pg/mL (ref 2.0–4.4)

## 2016-12-13 LAB — BASIC METABOLIC PANEL
ANION GAP: 8 (ref 5–15)
CO2: 23 mmol/L (ref 22–32)
Calcium: 8.2 mg/dL — ABNORMAL LOW (ref 8.9–10.3)
Chloride: 102 mmol/L (ref 101–111)
Creatinine, Ser: 0.42 mg/dL — ABNORMAL LOW (ref 0.44–1.00)
GLUCOSE: 86 mg/dL (ref 65–99)
Potassium: 3.7 mmol/L (ref 3.5–5.1)
SODIUM: 133 mmol/L — AB (ref 135–145)

## 2016-12-13 LAB — MAGNESIUM: Magnesium: 1.5 mg/dL — ABNORMAL LOW (ref 1.7–2.4)

## 2016-12-13 LAB — PHOSPHORUS: PHOSPHORUS: 2.9 mg/dL (ref 2.5–4.6)

## 2016-12-13 MED ORDER — MAGNESIUM SULFATE 2 GM/50ML IV SOLN
2.0000 g | Freq: Once | INTRAVENOUS | Status: AC
  Administered 2016-12-13: 2 g via INTRAVENOUS
  Filled 2016-12-13: qty 50

## 2016-12-13 NOTE — Care Management (Signed)
Patient is currently followed by Home Health [facility] with [facility]. RNCM will follow. Need PT evaluation when appropriate.

## 2016-12-13 NOTE — Progress Notes (Signed)
[facility] [place] [facility]   PATIENT NAME: Carla Wilson    MR#:  [id number]  DATE OF BIRTH:  05/21/1950  SUBJECTIVE:  CHIEF COMPLAINT:   Chief Complaint  Patient presents with  . Dizziness  . Hallucinations   Brought by family due to hallucinations. She has chronic psychiatric history and her compliance to her medications is questionable. As per her sisters were present in the room, she lives with her grandson who is also" a drug addict" as per them. And they are not sure if patient is taking her medicine on her own over the giving it to them. She is noted to have some electrolyte imbalance and some signs of sinusitis so admitted to medical services. During my visit patient is completely confused and keeps talking while looking at the wall to some imaginary people and was also seeing some feathers flying in the room. As per her sisters she was recently given and filled up oxycodone 112 tablets, 10-15 days ago and there are very few tablets left in her bottle so they were suspecting she may have overdosed on that. But her urine drug screen is negative for cough pured so she may not have taken any.    Today more alert and making some sense in her talking. Still have confusion in between.  REVIEW OF SYSTEMS:   Because of confusion and hallucination , patient is not able to give review of system.  ROS  DRUG ALLERGIES:   Allergies  Allergen Reactions  . Aspirin Other (See Comments)    Upset stomach  . Meperidine Hcl Nausea And Vomiting  . Penicillins Swelling    Has patient had a PCN reaction causing immediate rash, facial/tongue/throat swelling, SOB or lightheadedness with hypotension: Yes Has patient had a PCN reaction causing severe rash involving mucus membranes or skin necrosis: No Has patient had a PCN reaction that required hospitalization: No Has patient had a PCN reaction occurring within the last 10 years: No If all of the above answers are "NO",  then may proceed with Cephalosporin use.  . Sulfa Antibiotics Nausea Only    VITALS:  Blood pressure 117/75, pulse (!) 104, temperature 98.2 F (36.8 C), temperature source Axillary, resp. rate 14, height 5\' 5"  (1.651 m), weight 50.2 kg (110 lb 11.2 oz), SpO2 97 %.  PHYSICAL EXAMINATION:   GENERAL:  66 y.o.-year-old patient lying in the bed with no acute distress.  Frail appearing, nontoxic appearing EYES: Pupils equal, round, reactive to light and accommodation. No scleral icterus. Extraocular muscles intact.  HEENT: Head atraumatic, normocephalic. Oropharynx and nasopharynx clear.  Dry mucous membranes NECK:  Supple, no jugular venous distention. No thyroid enlargement, no tenderness.  Poor skin turgor LUNGS: Normal breath sounds bilaterally, no wheezing, rales,rhonchi or crepitation. No use of accessory muscles of respiration.  CARDIOVASCULAR: S1, S2 normal. No murmurs, rubs, or gallops.  ABDOMEN: Soft, nontender, nondistended. Bowel sounds present. No organomegaly or mass.  EXTREMITIES: No pedal edema, cyanosis, or clubbing. Severe deformities on both hands due to RA. NEUROLOGIC: Cranial nerves II through XII are intact. Muscle strength 4/5 in all extremities. Gait not checked.  PSYCHIATRIC: The patient is alert and oriented x 1, confused, disoriented. SKIN: No obvious rash, lesion, or ulcer.    Physical Exam LABORATORY PANEL:   CBC Recent Labs  Lab 12/12/16 0505  WBC 6.2  HGB 10.2*  HCT 30.6*  PLT 412   ------------------------------------------------------------------------------------------------------------------  Chemistries  Recent Labs  Lab 12/13/16 0457  NA 133*  K 3.7  CL 102  CO2 23  GLUCOSE 86  BUN <5*  CREATININE 0.42*  CALCIUM 8.2*  MG 1.5*   ------------------------------------------------------------------------------------------------------------------  Cardiac Enzymes Recent Labs  Lab 12/11/16 1959  TROPONINI <0.03    ------------------------------------------------------------------------------------------------------------------  RADIOLOGY:  Dg Chest 2 View  Result Date: 12/11/2016 CLINICAL DATA:  Altered mental status. EXAM: CHEST  2 VIEW COMPARISON:  CT chest and chest x-ray dated November 03, 2016. FINDINGS: The cardiomediastinal silhouette is normal in size. Normal pulmonary vascularity. Atherosclerotic calcification of the aortic arch. Lungs remain hyperinflated. No focal consolidation, pleural effusion, or pneumothorax. No acute osseous abnormality. IMPRESSION: Persistent lung hyperinflation.  No active cardiopulmonary disease. Electronically Signed   By: Obie Dredge M.D.   On: 12/11/2016 20:32   Ct Head Wo Contrast  Result Date: 12/11/2016 CLINICAL DATA:  Pt states that she has been feeling dizzy for last 3 days. Pt family reports confusion for last 3 days. Pt states that she has been feeling dizzy for last 3 days. Pt family reports confusion for last 3 days. EXAM: CT HEAD WITHOUT CONTRAST TECHNIQUE: Contiguous axial images were obtained from the base of the skull through the vertex without intravenous contrast. COMPARISON:  04/07/2011 FINDINGS: Brain: No evidence of acute infarction, hemorrhage, hydrocephalus, extra-axial collection or mass lesion/mass effect. Vascular: No hyperdense vessel or unexpected calcification. Skull: Normal. Negative for fracture or focal lesion. Sinuses/Orbits: Globes orbits are unremarkable. Small amount of dependent fluid is seen in the right maxillary sinus and both sphenoid sinuses. Remaining sinuses are clear. Clear mastoid air cells. Other: None. IMPRESSION: 1. No intracranial abnormality. 2. Dependent fluid seen in the right maxillary and both sphenoid sinuses. Consider acute sinusitis in the proper clinical setting. Electronically Signed   By: Amie Portland M.D.   On: 12/11/2016 20:41   Mr Brain Wo Contrast  Result Date: 12/12/2016 CLINICAL DATA:  Memory loss  EXAM: MRI HEAD WITHOUT CONTRAST TECHNIQUE: Multiplanar, multiecho pulse sequences of the brain and surrounding structures were obtained without intravenous contrast. COMPARISON:  Head CT 12/11/2016 FINDINGS: Brain: The midline structures are normal. There is no acute infarct or acute hemorrhage. No mass lesion, hydrocephalus, dural abnormality or extra-axial collection. Minimal periventricular white matter hyperintensity, not unexpected for age. No age-advanced or lobar predominant atrophy. No chronic microhemorrhage or superficial siderosis. Vascular: Major intracranial arterial and venous sinus flow voids are preserved. Skull and upper cervical spine: The visualized skull base, calvarium, upper cervical spine and extracranial soft tissues are normal. Sinuses/Orbits: No fluid levels or advanced mucosal thickening. No mastoid or middle ear effusion. Normal orbits. IMPRESSION: 1. No acute abnormality. 2. Normal appearance of the aging brain without age advanced or lobar predominant atrophy. Electronically Signed   By: Deatra Robinson M.D.   On: 12/12/2016 13:26   US Thyroid  Result Date: 12/12/2016 CLINICAL DATA:  Multinodular thyroid, previous right thyroid dominant nodule biopsy 04/27/2006 hypothyroidism EXAM: THYROID ULTRASOUND TECHNIQUE: Ultrasound examination of the thyroid gland and adjacent soft tissues was performed. COMPARISON:  04/02/2006, 12/07/2006 FINDINGS: Parenchymal Echotexture: Moderately heterogenous Isthmus: 2 mm Right lobe: 5.7 x 1.8 x 2.7 cm, previously 4.8 x 1.8 x 2.4 cm Left lobe: 4.1 x 1.5 x 1.8 cm, previously 4.7 x 1.6 x 2.2 cm _________________________________________________________ Estimated total number of nodules >/= 1 cm: 6-10 Number of spongiform nodules >/=  2 cm not described below (TR1): 0 Number of mixed cystic and solid nodules >/= 1.5 cm not described below (TR2): 3 _________________________________________________________ Nodule # 3: Location: Right; Mid Maximum size:  3.1,  previously 2.5 cm; Other 2 dimensions: 2.1 x 1.7, previously 1.9 x 1.2 cm Composition: solid/almost completely solid (2) Echogenicity: isoechoic (1) Shape: not taller-than-wide (0) Margins: ill-defined (0) Echogenic foci: none (0) ACR TI-RADS total points: 3. ACR TI-RADS risk category: TR3 (3 points). ACR TI-RADS recommendations: **Given size (>/= 2.5 cm) and appearance, fine needle aspiration of this mildly suspicious nodule should be considered based on TI-RADS criteria. _________________________________________________________ Nodule # 6: Location: Left; Superior Maximum size: 1.7, previously 1.0 cm; Other 2 dimensions: 1.4 x 1.0, previously 0.9 x 0.7 cm Composition: solid/almost completely solid (2) Echogenicity: isoechoic (1) Shape: not taller-than-wide (0) Margins: smooth (0) Echogenic foci: none (0) ACR TI-RADS total points: 3. ACR TI-RADS risk category: TR3 (3 points). ACR TI-RADS recommendations: *Given size (>/= 1.5 - 2.4 cm) and appearance, a follow-up ultrasound in 1 year should be considered based on TI-RADS criteria. _________________________________________________________ Nodule # 7: Location: Left; Mid Maximum size: 1.2, previously 0.7 cm; Other 2 dimensions: 1.0 x 0.6, previously 0.7 x 0.4 cm Composition: solid/almost completely solid (2) Echogenicity: hypoechoic (2) Shape: not taller-than-wide (0) Margins: smooth (0) Echogenic foci: none (0) ACR TI-RADS total points: 4. ACR TI-RADS risk category: TR4 (4-6 points). ACR TI-RADS recommendations: *Given size (>/= 1 - 1.4 cm) and appearance, a follow-up ultrasound in 1 year should be considered based on TI-RADS criteria. _________________________________________________________ There are additional bilateral mixed cystic and solid nodules as well less subcentimeter hypoechoic nodules which are not fully described by TI RADS criteria IMPRESSION: 3.1 cm right mid thyroid TR 3 nodule meets criteria for biopsy as above. Additional left TR 3 and TR 4  nodules as above meet criteria for follow-up in 1 year. The above is in keeping with the ACR TI-RADS recommendations - J Am Coll Radiol 2017;14:[id number]. Electronically Signed   By: Judie Petit.  Shick M.D.   On: 12/12/2016 13:41    ASSESSMENT AND PLAN:   Principal Problem:   Acute delirium Active Problems:   Chronic prescription opiate use   Long term prescription benzodiazepine use  1 acute psychosis Exact etiology is unknown Suspect due to multifactorial process which includes polypharmacy as patient is on numerous psychotropic meds (Xanax, Celexa, opioid medications, Cymbalta, steroids, tramadol, Ambien) and acute probable sinusitis Her compliance to her medication is questionable, as per her sister's- the other members that family might be using her medications as her urine is negative for all. Otherwise she has almost empty bottle of recently filled oxycodone. neurochecks per routine, hold psychotropic meds, treatment of acute sinusitis with Z-Pak, IV fluids for rehydration, fall/aspiration precautions, check ammonia level, check RPR, check MRI of the brain for further evaluation, consult psychiatry for expert opinion As she was on Xanax, I would like to add small dose of Xanax to avoid withdrawal symptoms.   Appreciated Psych eval- suspect benzo withdrawal.  2 acute probable sinusitis Z-Pak CT head noted MRI brain negative.  3 acute hyperthyroidism, unspecified TSH 0.183, T4 1.25 Check free T3 level, thyroid ultrasound for further investigation  Have some thyroid nodules, need further eval on that, get IR consult.  4 acute dehydration- Hypokalemia, Hypomagnesemia IV fluids for rehydration, replacement.  5 acute hyponatremia/hypokalemia/hypochloremia Replete with IV fluids and check BMP in the morning.  6 chronic rheumatoid arthritis With associated chronic pain syndrome Continue oxycodone for now Hold steroids as these can also cause encephalopathy On family request, call  hematology consult has they are concerned for flareup of RA.  As per rheumatologist- no steroids while psychosis.  All the  records are reviewed and case discussed with Care Management/Social Workerr. Management plans discussed with the patient, family and they are in agreement.  CODE STATUS: Full.  TOTAL TIME TAKING CARE OF THIS PATIENT: 35 minutes.    POSSIBLE D/C IN 1-2 DAYS, DEPENDING ON CLINICAL CONDITION.   Altamese Dilling M.D on 12/13/2016   Between 7am to 6pm - Pager - [phone]  After 6pm go to [email or web address] - password [email or web address]  [facility]  Office  [phone]  CC: Primary care physician; System, Pcp Not In  Note: This dictation was prepared with Dragon dictation along with smaller phrase technology. Any transcriptional errors that result from this process are unintentional.

## 2016-12-13 NOTE — Consult Note (Signed)
[facility] Consult   Reason for Consult: Follow-up consult for this 66 year old woman in the hospital with delirium Referring Physician:  Marthann Schiller Patient Identification: Carla Wilson MRN:  [id number] Principal Diagnosis: Acute delirium Diagnosis:   Patient Active Problem List   Diagnosis Date Noted  . Acute psychosis ([place]) [F23] 12/12/2016  . Acute delirium [R41.0] 12/12/2016  . Chronic prescription opiate use [Z79.891] 12/12/2016  . Long term prescription benzodiazepine use [Z79.899] 12/12/2016  . Depression [F32.9] 11/03/2016  . CAP (community acquired pneumonia) [J18.9] 11/03/2016  . NSTEMI (non-ST elevated myocardial infarction) ([place]) [I21.4] 11/03/2016  . Multiple contusions [T07.XXXA] 04/13/2011  . URI (upper respiratory infection) [J06.9] 03/13/2011  . LEG PAIN, RIGHT [M79.609] 11/03/2009  . NONTRAUMATIC RUPTURE OF UNSPECIFIED TENDON [M66.9] 09/21/2009  . PULMONARY DISEASE [J98.4] 06/17/2009  . HYPOTHYROIDISM [E03.9] 06/01/2009  . Anxiety [F41.9] 06/01/2009  . PANIC ATTACK [F41.0] 06/01/2009  . MIGRAINE HEADACHE [G43.909] 06/01/2009  . Rheumatoid arthritis ([place]) [M06.9] 06/01/2009  . FIBROMYALGIA [IMO0001] 06/01/2009  . INSOMNIA [G47.00] 06/01/2009  . CHRONIC FATIGUE SYNDROME [R53.82] 06/01/2009    Total Time spent with patient: 30 minutes  Subjective:   Carla Wilson is a 66 y.o. female patient admitted with "I guess my pneumonia".  HPI: See yesterday's note.  Today the patient is significantly better.  I found her awake and interacting with 1 of her sisters.  Patient was oriented to time place and basically to the situation.  She could identify everyone in the room.  She was able to give me lucid information and engage in a conversation and stay on topic.  Did not report any hallucinations today.  Patient admits that her health has been declining and her nerves have been bad ever since her daughter died.  Patient also talked with me at some length  about the stress she has to deal with with her "wild child" grandson living in the house.  Patient admitted that it was entirely possible that she could have accidentally taken either too much or too little of her prescription medicines.  Not reporting any suicidal ideation.  Past Psychiatric History: Past history of anxiety and depression managed by her outpatient psychiatrist.  Apparently she is on antidepressants but also appears to be prescribed fairly high doses of Xanax and Adderall.  I suspect she may not even be taking the Adderall.  No history of suicide attempts.  Risk to Self: Is patient at risk for suicide?: No Risk to Others:   Prior Inpatient Therapy:   Prior Outpatient Therapy:    Past Medical History:  Past Medical History:  Diagnosis Date  . Anxiety   . Collagen vascular disease ([place])   . Depression   . Fibromyalgia   . Pulmonary disease    NOS  . RA (rheumatoid arthritis) ([place])   . Rheumatoid arthritis [facility])     Past Surgical History:  Procedure Laterality Date  . ABDOMINAL HYSTERECTOMY    . CESAREAN SECTION     Family History:  Family History  Problem Relation Age of Onset  . Cancer Mother   . Arthritis Father   . Cancer Father   . Cancer Sister   . Heart disease Maternal Grandfather   . Stroke Paternal Grandfather    Family Psychiatric  History: Family history of anxiety and of substance abuse Social History:  Social History   Substance and Sexual Activity  Alcohol Use No     Social History   Substance and Sexual Activity  Drug Use No  Social History   Socioeconomic History  . Marital status: Widowed    Spouse name: None  . Number of children: None  . Years of education: None  . Highest education level: None  Social Needs  . Financial resource strain: None  . Food insecurity - worry: None  . Food insecurity - inability: None  . Transportation needs - medical: None  . Transportation needs - non-medical: None  Occupational History  .  None  Tobacco Use  . Smoking status: Never Smoker  . Smokeless tobacco: Never Used  Substance and Sexual Activity  . Alcohol use: No  . Drug use: No  . Sexual activity: None  Other Topics Concern  . None  Social History Narrative  . None   Additional Social History:    Allergies:   Allergies  Allergen Reactions  . Aspirin Other (See Comments)    Upset stomach  . Meperidine Hcl Nausea And Vomiting  . Penicillins Swelling    Has patient had a PCN reaction causing immediate rash, facial/tongue/throat swelling, SOB or lightheadedness with hypotension: Yes Has patient had a PCN reaction causing severe rash involving mucus membranes or skin necrosis: No Has patient had a PCN reaction that required hospitalization: No Has patient had a PCN reaction occurring within the last 10 years: No If all of the above answers are "NO", then may proceed with Cephalosporin use.  . Sulfa Antibiotics Nausea Only    Labs:  Results for orders placed or performed during the hospital encounter of 12/11/16 (from the past 48 hour(s))  Urinalysis, Complete w Microscopic     Status: Abnormal   Collection Time: 12/11/16  9:10 PM  Result Value Ref Range   Color, Urine YELLOW (A) YELLOW   APPearance CLEAR (A) CLEAR   Specific Gravity, Urine 1.010 1.005 - 1.030   pH 6.0 5.0 - 8.0   Glucose, UA NEGATIVE NEGATIVE mg/dL   Hgb urine dipstick SMALL (A) NEGATIVE   Bilirubin Urine NEGATIVE NEGATIVE   Ketones, ur 5 (A) NEGATIVE mg/dL   Protein, ur NEGATIVE NEGATIVE mg/dL   Nitrite NEGATIVE NEGATIVE   Leukocytes, UA NEGATIVE NEGATIVE   RBC / HPF 0-5 0 - 5 RBC/hpf   WBC, UA 0-5 0 - 5 WBC/hpf   Bacteria, UA NONE SEEN NONE SEEN   Squamous Epithelial / LPF 0-5 (A) NONE SEEN   Mucus PRESENT   Urine Drug Screen, Qualitative ([facility] only)     Status: Abnormal   Collection Time: 12/11/16  9:10 PM  Result Value Ref Range   Tricyclic, Ur Screen POSITIVE (A) NONE DETECTED   Amphetamines, Ur Screen NONE DETECTED NONE  DETECTED   MDMA (Ecstasy)Ur Screen NONE DETECTED NONE DETECTED   Cocaine Metabolite,Ur [place] NONE DETECTED NONE DETECTED   Opiate, Ur Screen NONE DETECTED NONE DETECTED   Phencyclidine (PCP) Ur S NONE DETECTED NONE DETECTED   Cannabinoid 50 Ng, Ur [place] NONE DETECTED NONE DETECTED   Barbiturates, Ur Screen NONE DETECTED NONE DETECTED   Benzodiazepine, Ur Scrn POSITIVE (A) NONE DETECTED   Methadone Scn, Ur NONE DETECTED NONE DETECTED    Comment: (NOTE) 815  Tricyclics, urine               Cutoff 1000 ng/mL 200  Amphetamines, urine             Cutoff 1000 ng/mL 300  MDMA (Ecstasy), urine           Cutoff 500 ng/mL 400  Cocaine Metabolite, urine  Cutoff 300 ng/mL 500  Opiate, urine                   Cutoff 300 ng/mL 600  Phencyclidine (PCP), urine      Cutoff 25 ng/mL 700  Cannabinoid, urine              Cutoff 50 ng/mL 800  Barbiturates, urine             Cutoff 200 ng/mL 900  Benzodiazepine, urine           Cutoff 200 ng/mL 1000 Methadone, urine                Cutoff 300 ng/mL 1100 1200 The urine drug screen provides only a preliminary, unconfirmed 1300 analytical test result and should not be used for non-medical 1400 purposes. Clinical consideration and professional judgment should 1500 be applied to any positive drug screen result due to possible 1600 interfering substances. A more specific alternate chemical method 1700 must be used in order to obtain a confirmed analytical result.  1800 Gas chromato graphy / mass spectrometry (GC/MS) is the preferred 1900 confirmatory method.   CBC     Status: Abnormal   Collection Time: 12/12/16  5:05 AM  Result Value Ref Range   WBC 6.2 3.6 - 11.0 K/uL   RBC 3.68 (L) 3.80 - 5.20 MIL/uL   Hemoglobin 10.2 (L) 12.0 - 16.0 g/dL   HCT 30.6 (L) 35.0 - 47.0 %   MCV 83.3 80.0 - 100.0 fL   MCH 27.6 26.0 - 34.0 pg   MCHC 33.2 32.0 - 36.0 g/dL   RDW 16.8 (H) 11.5 - 14.5 %   Platelets 412 150 - 440 K/uL  Basic metabolic panel     Status: Abnormal    Collection Time: 12/12/16  5:05 AM  Result Value Ref Range   Sodium 133 (L) 135 - 145 mmol/L   Potassium 2.9 (L) 3.5 - 5.1 mmol/L   Chloride 100 (L) 101 - 111 mmol/L   CO2 25 22 - 32 mmol/L   Glucose, Bld 104 (H) 65 - 99 mg/dL   BUN 8 6 - 20 mg/dL   Creatinine, Ser 0.52 0.44 - 1.00 mg/dL   Calcium 8.3 (L) 8.9 - 10.3 mg/dL   GFR calc non Af Amer >60 >60 mL/min   GFR calc Af Amer >60 >60 mL/min    Comment: (NOTE) The eGFR has been calculated using the CKD EPI equation. This calculation has not been validated in all clinical situations. eGFR's persistently <60 mL/min signify possible Chronic Kidney Disease.    Anion gap 8 5 - 15  T3, Free     Status: None   Collection Time: 12/12/16  5:05 AM  Result Value Ref Range   T3, Free 3.2 2.0 - 4.4 pg/mL    Comment: (NOTE) Performed At: [facility] [place], [place] [id number] Rush Farmer MD [id number]   Magnesium     Status: Abnormal   Collection Time: 12/12/16  5:05 AM  Result Value Ref Range   Magnesium 1.5 (L) 1.7 - 2.4 mg/dL  Basic metabolic panel     Status: Abnormal   Collection Time: 12/13/16  4:57 AM  Result Value Ref Range   Sodium 133 (L) 135 - 145 mmol/L   Potassium 3.7 3.5 - 5.1 mmol/L   Chloride 102 101 - 111 mmol/L   CO2 23 22 - 32 mmol/L   Glucose, Bld 86 65 - 99 mg/dL  BUN <5 (L) 6 - 20 mg/dL   Creatinine, Ser 0.42 (L) 0.44 - 1.00 mg/dL   Calcium 8.2 (L) 8.9 - 10.3 mg/dL   GFR calc non Af Amer >60 >60 mL/min   GFR calc Af Amer >60 >60 mL/min    Comment: (NOTE) The eGFR has been calculated using the CKD EPI equation. This calculation has not been validated in all clinical situations. eGFR's persistently <60 mL/min signify possible Chronic Kidney Disease.    Anion gap 8 5 - 15  Phosphorus     Status: None   Collection Time: 12/13/16  4:57 AM  Result Value Ref Range   Phosphorus 2.9 2.5 - 4.6 mg/dL  Magnesium     Status: Abnormal   Collection Time: 12/13/16  4:57 AM  Result  Value Ref Range   Magnesium 1.5 (L) 1.7 - 2.4 mg/dL    Current Facility-Administered Medications  Medication Dose Route Frequency Provider Last Rate Last Dose  . 0.9 % NaCl with KCl 20 mEq/ L  infusion   Intravenous Continuous Loney Hering D, MD 125 mL/hr at 12/13/16 1458    . acetaminophen (TYLENOL) tablet 650 mg  650 mg Oral Q6H PRN Salary, Montell D, MD       Or  . acetaminophen (TYLENOL) suppository 650 mg  650 mg Rectal Q6H PRN Salary, Montell D, MD      . albuterol (PROVENTIL) (2.5 MG/3ML) 0.083% nebulizer solution 3 mL  3 mL Inhalation Q6H PRN Salary, Montell D, MD      . ALPRAZolam Duanne Moron) tablet 1 mg  1 mg Oral BID Kelley Knoth, Madie Reno, MD   1 mg at 12/13/16 [phone]  . amLODipine (NORVASC) tablet 10 mg  10 mg Oral Daily Vaughan Basta, MD   10 mg at 12/13/16 7416  . azithromycin (ZITHROMAX) tablet 500 mg  500 mg Oral Daily Vaughan Basta, MD   500 mg at 12/13/16 3845  . feeding supplement (ENSURE ENLIVE) (ENSURE ENLIVE) liquid 237 mL  237 mL Oral TID BM Vaughan Basta, MD   237 mL at 12/13/16 1528  . multivitamin with minerals tablet 1 tablet  1 tablet Oral Daily Salary, Avel Peace, MD   1 tablet at 12/13/16 [phone]  . ondansetron (ZOFRAN) tablet 4 mg  4 mg Oral Q6H PRN Salary, Montell D, MD       Or  . ondansetron (ZOFRAN) injection 4 mg  4 mg Intravenous Q6H PRN Salary, Montell D, MD      . oxyCODONE (Oxy IR/ROXICODONE) immediate release tablet 15 mg  15 mg Oral QID Loney Hering D, MD   15 mg at 12/13/16 1808  . polyethylene glycol (MIRALAX / GLYCOLAX) packet 17 g  17 g Oral Daily PRN Salary, Montell D, MD      . risperiDONE (RISPERDAL M-TABS) disintegrating tablet 0.5 mg  0.5 mg Oral QHS Rileigh Kawashima T, MD   0.5 mg at 12/12/16 2038  . senna-docusate (Senokot-S) tablet 1 tablet  1 tablet Oral BID Gorden Harms, MD   1 tablet at 12/13/16 [phone]  . sodium chloride flush (NS) 0.9 % injection 3 mL  3 mL Intravenous Q12H Salary, Montell D, MD   3 mL at 12/12/16 1224     Musculoskeletal: Strength & Muscle Tone: decreased Gait & Station: unsteady Patient leans: N/A  Psychiatric Specialty Exam: Physical Exam  Nursing note and vitals reviewed. Constitutional: She appears well-developed and well-nourished.  HENT:  Head: Normocephalic and atraumatic.  Eyes: Conjunctivae are normal. Pupils are equal, round,  and reactive to light.  Neck: Normal range of motion.  Cardiovascular: Normal heart sounds.  Respiratory: Effort normal. No respiratory distress.  GI: Soft.  Musculoskeletal: Normal range of motion.  Neurological: She is alert.  Skin: Skin is warm and dry.  Psychiatric: Her speech is normal and behavior is normal. Judgment normal. Her mood appears anxious. Thought content is not paranoid. She expresses no homicidal and no suicidal ideation. She exhibits abnormal recent memory.    Review of Systems  Constitutional: Negative.   HENT: Negative.   Eyes: Negative.   Respiratory: Negative.   Cardiovascular: Negative.   Gastrointestinal: Negative.   Musculoskeletal: Negative.   Skin: Negative.   Neurological: Negative.   Psychiatric/Behavioral: Positive for memory loss. Negative for depression, hallucinations, substance abuse and suicidal ideas. The patient is nervous/anxious. The patient does not have insomnia.     Blood pressure (!) 145/75, pulse (!) 115, temperature 98.6 F (37 C), resp. rate 17, height '5\' 5"'  (1.651 m), weight 50.2 kg (110 lb 11.2 oz), SpO2 95 %.Body mass index is 18.42 kg/m.  General Appearance: Casual  Eye Contact:  Good  Speech:  Normal Rate  Volume:  Normal  Mood:  Anxious  Affect:  Congruent  Thought Process:  Goal Directed  Orientation:  Full (Time, Place, and Person)  Thought Content:  Logical and Tangential  Suicidal Thoughts:  No  Homicidal Thoughts:  No  Memory:  Immediate;   Good Recent;   Fair Remote;   Fair  Judgement:  Fair  Insight:  Fair  Psychomotor Activity:  Decreased  Concentration:   Concentration: Fair  Recall:  [employer] of Knowledge:  Fair  Language:  Fair  Akathisia:  No  Handed:  Right  AIMS (if indicated):     Assets:  Desire for Improvement Housing Social Support  ADL's:  Impaired  Cognition:  Impaired,  Mild  Sleep:        Treatment Plan Summary: Medication management and Plan The patient is vastly improved over yesterday.  Did not appear to be delirious at all during my conversation with her.  Still not certain what the underlying etiology of all of this was although I think it is very likely that involved her prescription medicines one way or another.  She seems to be stabilizing now on what she is taking.  I talk with her a little bit about the risks of high doses of medicines and the risks of misuse whether intentional or accidental of medicine all of which she agreed with.  Patient was fundamentally in agreement with the idea that she needs more assistance taking care of her health and her medication when she gets out of the hospital.  Does not require inpatient hospitalization.  No change to psychiatric medicine for now.  I will follow as needed.  Disposition: Patient does not meet criteria for psychiatric inpatient admission. Supportive therapy provided about ongoing stressors.  Alethia Berthold, MD 12/13/2016 8:33 PM

## 2016-12-14 LAB — MAGNESIUM: Magnesium: 1.9 mg/dL (ref 1.7–2.4)

## 2016-12-14 LAB — PHOSPHORUS: PHOSPHORUS: 3.6 mg/dL (ref 2.5–4.6)

## 2016-12-14 MED ORDER — CITALOPRAM HYDROBROMIDE 20 MG PO TABS
40.0000 mg | ORAL_TABLET | Freq: Every day | ORAL | Status: DC
  Administered 2016-12-14 – 2016-12-15 (×2): 40 mg via ORAL
  Filled 2016-12-14 (×2): qty 2

## 2016-12-14 MED ORDER — ALPRAZOLAM 0.5 MG PO TABS
0.5000 mg | ORAL_TABLET | Freq: Two times a day (BID) | ORAL | Status: DC
  Administered 2016-12-14 – 2016-12-15 (×2): 0.5 mg via ORAL
  Filled 2016-12-14 (×2): qty 1

## 2016-12-14 MED ORDER — DULOXETINE HCL 20 MG PO CPEP
40.0000 mg | ORAL_CAPSULE | Freq: Every day | ORAL | Status: DC
  Filled 2016-12-14: qty 2

## 2016-12-14 MED ORDER — OXYCODONE HCL 5 MG PO TABS
15.0000 mg | ORAL_TABLET | Freq: Four times a day (QID) | ORAL | Status: DC
  Administered 2016-12-14 – 2016-12-15 (×5): 15 mg via ORAL
  Filled 2016-12-14 (×5): qty 3

## 2016-12-14 NOTE — Consult Note (Signed)
[facility] Consult   Reason for Consult: Follow-up note for 66 year old woman brought to the hospital with delirium. Referring Physician: Vallarie Mare Patient Identification: Carla Wilson MRN:  [id number] Principal Diagnosis: Acute delirium Diagnosis:   Patient Active Problem List   Diagnosis Date Noted  . Acute psychosis ([place]) [F23] 12/12/2016  . Acute delirium [R41.0] 12/12/2016  . Chronic prescription opiate use [Z79.891] 12/12/2016  . Long term prescription benzodiazepine use [Z79.899] 12/12/2016  . Depression [F32.9] 11/03/2016  . CAP (community acquired pneumonia) [J18.9] 11/03/2016  . NSTEMI (non-ST elevated myocardial infarction) ([place]) [I21.4] 11/03/2016  . Multiple contusions [T07.XXXA] 04/13/2011  . URI (upper respiratory infection) [J06.9] 03/13/2011  . LEG PAIN, RIGHT [M79.609] 11/03/2009  . NONTRAUMATIC RUPTURE OF UNSPECIFIED TENDON [M66.9] 09/21/2009  . PULMONARY DISEASE [J98.4] 06/17/2009  . HYPOTHYROIDISM [E03.9] 06/01/2009  . Anxiety [F41.9] 06/01/2009  . PANIC ATTACK [F41.0] 06/01/2009  . MIGRAINE HEADACHE [G43.909] 06/01/2009  . Rheumatoid arthritis ([place]) [M06.9] 06/01/2009  . FIBROMYALGIA [IMO0001] 06/01/2009  . INSOMNIA [G47.00] 06/01/2009  . CHRONIC FATIGUE SYNDROME [R53.82] 06/01/2009    Total Time spent with patient: 20 minutes  Subjective:   Carla Wilson is a 66 y.o. female patient admitted with see previous notes.  Compared to yesterday the patient this afternoon seem to be a little more confused.  Her sisters were not in the room which I am sure means there was less stimulation for her.  She was also complaining that her joint pain was worse today.  She seemed a little out of it and was not making good eye contact but on direct questioning she knew where she was and knew the correct year and month.  Able to back and forth lucid conversation without completely losing track of conversation.Marland Kitchen  HPI: Patient came into the hospital with a  delirium of unclear etiology.  Has lost a lot of weight recently.  He is on high doses of several controlled substances and may have been having some benzodiazepine withdrawal  Past Psychiatric History: Past history of depression and anxiety being treated as an outpatient  Risk to Self: Is patient at risk for suicide?: No Risk to Others:   Prior Inpatient Therapy:   Prior Outpatient Therapy:    Past Medical History:  Past Medical History:  Diagnosis Date  . Anxiety   . Collagen vascular disease ([place])   . Depression   . Fibromyalgia   . Pulmonary disease    NOS  . RA (rheumatoid arthritis) ([place])   . Rheumatoid arthritis [facility])     Past Surgical History:  Procedure Laterality Date  . ABDOMINAL HYSTERECTOMY    . CESAREAN SECTION     Family History:  Family History  Problem Relation Age of Onset  . Cancer Mother   . Arthritis Father   . Cancer Father   . Cancer Sister   . Heart disease Maternal Grandfather   . Stroke Paternal Grandfather    Family Psychiatric  History: Positive for substance abuse and anxiety Social History:  Social History   Substance and Sexual Activity  Alcohol Use No     Social History   Substance and Sexual Activity  Drug Use No    Social History   Socioeconomic History  . Marital status: Widowed    Spouse name: None  . Number of children: None  . Years of education: None  . Highest education level: None  Social Needs  . Financial resource strain: None  . Food insecurity - worry: None  .  Food insecurity - inability: None  . Transportation needs - medical: None  . Transportation needs - non-medical: None  Occupational History  . None  Tobacco Use  . Smoking status: Never Smoker  . Smokeless tobacco: Never Used  Substance and Sexual Activity  . Alcohol use: No  . Drug use: No  . Sexual activity: None  Other Topics Concern  . None  Social History Narrative  . None   Additional Social History:    Allergies:   Allergies   Allergen Reactions  . Aspirin Other (See Comments)    Upset stomach  . Meperidine Hcl Nausea And Vomiting  . Penicillins Swelling    Has patient had a PCN reaction causing immediate rash, facial/tongue/throat swelling, SOB or lightheadedness with hypotension: Yes Has patient had a PCN reaction causing severe rash involving mucus membranes or skin necrosis: No Has patient had a PCN reaction that required hospitalization: No Has patient had a PCN reaction occurring within the last 10 years: No If all of the above answers are "NO", then may proceed with Cephalosporin use.  . Sulfa Antibiotics Nausea Only    Labs:  Results for orders placed or performed during the hospital encounter of 12/11/16 (from the past 48 hour(s))  Basic metabolic panel     Status: Abnormal   Collection Time: 12/13/16  4:57 AM  Result Value Ref Range   Sodium 133 (L) 135 - 145 mmol/L   Potassium 3.7 3.5 - 5.1 mmol/L   Chloride 102 101 - 111 mmol/L   CO2 23 22 - 32 mmol/L   Glucose, Bld 86 65 - 99 mg/dL   BUN <5 (L) 6 - 20 mg/dL   Creatinine, Ser 0.42 (L) 0.44 - 1.00 mg/dL   Calcium 8.2 (L) 8.9 - 10.3 mg/dL   GFR calc non Af Amer >60 >60 mL/min   GFR calc Af Amer >60 >60 mL/min    Comment: (NOTE) The eGFR has been calculated using the CKD EPI equation. This calculation has not been validated in all clinical situations. eGFR's persistently <60 mL/min signify possible Chronic Kidney Disease.    Anion gap 8 5 - 15  Phosphorus     Status: None   Collection Time: 12/13/16  4:57 AM  Result Value Ref Range   Phosphorus 2.9 2.5 - 4.6 mg/dL  Magnesium     Status: Abnormal   Collection Time: 12/13/16  4:57 AM  Result Value Ref Range   Magnesium 1.5 (L) 1.7 - 2.4 mg/dL  Phosphorus     Status: None   Collection Time: 12/14/16  3:29 AM  Result Value Ref Range   Phosphorus 3.6 2.5 - 4.6 mg/dL  Magnesium     Status: None   Collection Time: 12/14/16  3:29 AM  Result Value Ref Range   Magnesium 1.9 1.7 - 2.4  mg/dL    Current Facility-Administered Medications  Medication Dose Route Frequency Provider Last Rate Last Dose  . acetaminophen (TYLENOL) tablet 650 mg  650 mg Oral Q6H PRN Salary, Montell D, MD       Or  . acetaminophen (TYLENOL) suppository 650 mg  650 mg Rectal Q6H PRN Salary, Montell D, MD      . albuterol (PROVENTIL) (2.5 MG/3ML) 0.083% nebulizer solution 3 mL  3 mL Inhalation Q6H PRN Salary, Montell D, MD      . ALPRAZolam Duanne Moron) tablet 0.5 mg  0.5 mg Oral BID Vaughan Basta, MD      . amLODipine (NORVASC) tablet 10 mg  10  mg Oral Daily Vaughan Basta, MD   10 mg at 12/14/16 6270  . citalopram (CELEXA) tablet 40 mg  40 mg Oral Daily Vaughan Basta, MD   40 mg at 12/14/16 1710  . feeding supplement (ENSURE ENLIVE) (ENSURE ENLIVE) liquid 237 mL  237 mL Oral TID BM Vaughan Basta, MD   237 mL at 12/14/16 1717  . multivitamin with minerals tablet 1 tablet  1 tablet Oral Daily Salary, Avel Peace, MD   1 tablet at 12/14/16 0953  . ondansetron (ZOFRAN) tablet 4 mg  4 mg Oral Q6H PRN Salary, Montell D, MD       Or  . ondansetron (ZOFRAN) injection 4 mg  4 mg Intravenous Q6H PRN Salary, Montell D, MD      . oxyCODONE (Oxy IR/ROXICODONE) immediate release tablet 15 mg  15 mg Oral Q6H Vaughan Basta, MD   15 mg at 12/14/16 1718  . polyethylene glycol (MIRALAX / GLYCOLAX) packet 17 g  17 g Oral Daily PRN Salary, Montell D, MD      . risperiDONE (RISPERDAL M-TABS) disintegrating tablet 0.5 mg  0.5 mg Oral QHS Elberta Lachapelle T, MD   0.5 mg at 12/13/16 2152  . senna-docusate (Senokot-S) tablet 1 tablet  1 tablet Oral BID Gorden Harms, MD   1 tablet at 12/14/16 [phone]  . sodium chloride flush (NS) 0.9 % injection 3 mL  3 mL Intravenous Q12H Salary, Montell D, MD   3 mL at 12/12/16 9381    Musculoskeletal: Strength & Muscle Tone: decreased Gait & Station: unsteady Patient leans: Right  Psychiatric Specialty Exam: Physical Exam  Nursing note and vitals  reviewed. Constitutional: She appears well-developed and well-nourished.  HENT:  Head: Normocephalic and atraumatic.  Eyes: Conjunctivae are normal. Pupils are equal, round, and reactive to light.  Neck: Normal range of motion.  Cardiovascular: Normal heart sounds.  Respiratory: Effort normal.  GI: Soft.  Musculoskeletal: Normal range of motion.  Neurological: She is alert.  Skin: Skin is warm and dry.  Psychiatric: Judgment normal. Her affect is blunt. Her speech is delayed. She is slowed. Thought content is not paranoid. She expresses no homicidal and no suicidal ideation. She exhibits abnormal recent memory.    Review of Systems  Constitutional: Negative.   HENT: Negative.   Eyes: Negative.   Respiratory: Negative.   Cardiovascular: Negative.   Gastrointestinal: Negative.   Musculoskeletal: Positive for joint pain.  Skin: Negative.   Neurological: Negative.   Psychiatric/Behavioral: Positive for memory loss. Negative for depression, hallucinations, substance abuse and suicidal ideas. The patient is not nervous/anxious and does not have insomnia.     Blood pressure 96/74, pulse (!) 105, temperature 98.6 F (37 C), temperature source Oral, resp. rate 14, height '5\' 5"'  (1.651 m), weight 50.2 kg (110 lb 11.2 oz), SpO2 92 %.Body mass index is 18.42 kg/m.  General Appearance: Casual  Eye Contact:  Minimal  Speech:  Slow  Volume:  Decreased  Mood:  Euthymic  Affect:  Constricted  Thought Process:  Goal Directed  Orientation:  Full (Time, Place, and Person)  Thought Content:  Rumination  Suicidal Thoughts:  No  Homicidal Thoughts:  No  Memory:  Immediate;   Good Recent;   Fair Remote;   Fair  Judgement:  Fair  Insight:  Fair  Psychomotor Activity:  Decreased  Concentration:  Concentration: Poor  Recall:  [employer] of Knowledge:  Fair  Language:  Fair  Akathisia:  No  Handed:  Right  AIMS (  if indicated):     Assets:  Desire for Improvement Social Support  ADL's:   Impaired  Cognition:  Impaired,  Mild  Sleep:        Treatment Plan Summary: Medication management and Plan Patient appears to be more withdrawn and flat in her affect.  Not as interactive but still lucid when she does engage in conversation.  Possibly related to excessive pain.  I am not going to change the doses of medicine especially the Xanax that we had settled on yesterday.  Encourage patient to make sure she is eating well.  I will continue to follow up while she is in the hospital.  I agreed with the patient's assessment that she would not be able to take care of herself at home and is going to need some real assistance.  Disposition: Patient does not meet criteria for psychiatric inpatient admission. Supportive therapy provided about ongoing stressors.  Alethia Berthold, MD 12/14/2016 6:02 PM

## 2016-12-14 NOTE — Evaluation (Signed)
Physical Therapy Evaluation Patient Details Name: METTE SOUTHGATE MRN: [id number] DOB: 11/01/1950 Today's Date: 12/14/2016   History of Present Illness  presented to ER secondary to dizziness, hallucinations; admitted for acute psychosis, dehydration (?related to polypharmacy?)  Clinical Impression  Upon evaluation, patient alert and oriented to self, location; generally confused to more complex information.  Bilat UE/LE generally weak and deconditioned; significant arthritic changes throughout all extremities and joints.  Currently requiring min assist for bed mobility; min/mod assist for sit/stand, short-distance gait with RW.  Difficulty maintaining grasp on RW; poor balance and overall activity tolerance. Unsafe to attempt mobilization without RW and +1 at this time due to fall risk. Would benefit from skilled PT to address above deficits and promote optimal return to PLOF; recommend transition to STR upon discharge from acute hospitalization.     Follow Up Recommendations SNF    Equipment Recommendations       Recommendations for Other Services       Precautions / Restrictions Precautions Precautions: Fall Restrictions Weight Bearing Restrictions: No      Mobility  Bed Mobility Overal bed mobility: Needs Assistance Bed Mobility: Supine to Sit;Sit to Supine     Supine to sit: Min assist Sit to supine: Min assist      Transfers Overall transfer level: Needs assistance Equipment used: Rolling walker (2 wheeled) Transfers: Sit to/from Stand Sit to Stand: Min assist         General transfer comment: unable to open hands for full grasp of RW; tends to hook L UE around upper bar of walker for control and stabilization during tranfser  Ambulation/Gait Ambulation/Gait assistance: Min assist Ambulation Distance (Feet): 5 Feet Assistive device: Rolling walker (2 wheeled)       General Gait Details: steps forward/backward with RW; maintains R UE grasp on RW with  thumb/first finger, L forearm 'hooked' around top bar of walker.  Very short, choppy steps with flat foot contact; limited balance reactions.  Poor tolerance for WBing due to pain  Stairs            Wheelchair Mobility    Modified Rankin (Stroke Patients Only)       Balance Overall balance assessment: Needs assistance Sitting-balance support: No upper extremity supported;Feet supported Sitting balance-Leahy Scale: Good     Standing balance support: Bilateral upper extremity supported Standing balance-Leahy Scale: Fair                               Pertinent Vitals/Pain Pain Assessment: Faces Faces Pain Scale: Hurts whole lot Pain Location: generalized soreness, aching Pain Intervention(s): Limited activity within patient's tolerance;Monitored during session;Repositioned    Home Living   Living Arrangements: Other relatives Available Help at Discharge: Family;Available PRN/intermittently Type of Home: House Home Access: Stairs to enter Entrance Stairs-Rails: None Entrance Stairs-Number of Steps: 3 Home Layout: One level        Prior Function Level of Independence: Needs assistance   Gait / Transfers Assistance Needed: Performed for household distances independently  ADL's / Homemaking Assistance Needed: Performed by grandson; sisters assist with groceries        Hand Dominance        Extremity/Trunk Assessment   Upper Extremity Assessment Upper Extremity Assessment: (strength grossly at least 3-/5 throughout available range; significant arthritic limitations at all joints)    Lower Extremity Assessment Lower Extremity Assessment: Generalized weakness(strength grossly at least 3-/5 throughout available range; significant arthritic limitations at all  joints)       Communication   Communication: No difficulties  Cognition Arousal/Alertness: Awake/alert Behavior During Therapy: WFL for tasks assessed/performed                                    General Comments: oriented to self, location; follows simple commands      General Comments      Exercises Other Exercises Other Exercises: Sit/stand x2 with RW, min assist with increased time   Assessment/Plan    PT Assessment Patient needs continued PT services  PT Problem List Decreased strength;Decreased range of motion;Decreased activity tolerance;Decreased balance;Decreased mobility;Decreased cognition;Decreased safety awareness;Decreased knowledge of use of DME;Decreased knowledge of precautions;Pain       PT Treatment Interventions DME instruction;Gait training;Stair training;Functional mobility training;Therapeutic activities;Therapeutic exercise;Balance training;Patient/family education    PT Goals (Current goals can be found in the Care Plan section)  Acute Rehab PT Goals Patient Stated Goal: to try PT Goal Formulation: With patient Time For Goal Achievement: 12/28/16 Potential to Achieve Goals: Fair    Frequency Min 2X/week   Barriers to discharge Decreased caregiver support      Co-evaluation               AM-PAC PT "6 Clicks" Daily Activity  Outcome Measure Difficulty turning over in bed (including adjusting bedclothes, sheets and blankets)?: Unable Difficulty moving from lying on back to sitting on the side of the bed? : Unable Difficulty sitting down on and standing up from a chair with arms (e.g., wheelchair, bedside commode, etc,.)?: Unable Help needed moving to and from a bed to chair (including a wheelchair)?: A Lot Help needed walking in hospital room?: A Lot Help needed climbing 3-5 steps with a railing? : Total 6 Click Score: 8    End of Session Equipment Utilized During Treatment: Gait belt Activity Tolerance: Patient tolerated treatment well;Patient limited by pain Patient left: in bed;with call bell/phone within reach;with nursing/sitter in room Nurse Communication: Mobility status PT Visit Diagnosis: Muscle  weakness (generalized) (M62.81);Unsteadiness on feet (R26.81);Difficulty in walking, not elsewhere classified (R26.2)    Time: 6440-3474 PT Time Calculation (min) (ACUTE ONLY): 21 min   Charges:   PT Evaluation $PT Eval Low Complexity: 1 Low PT Treatments $Therapeutic Activity: 8-22 mins   PT G Codes:   PT G-Codes **NOT FOR INPATIENT CLASS** Functional Assessment Tool Used: AM-PAC 6 Clicks Basic Mobility Functional Limitation: Mobility: Walking and moving around Mobility: Walking and Moving Around Current Status (Q5956): At least 60 percent but less than 80 percent impaired, limited or restricted Mobility: Walking and Moving Around Goal Status [phone]): At least 20 percent but less than 40 percent impaired, limited or restricted    Camera Krienke H. Manson Passey, PT, DPT, NCS 12/14/16, 10:24 PM [phone]

## 2016-12-14 NOTE — Progress Notes (Signed)
Pt has slept most of the day. Per Sitter, pat has not tried to get out of bed. Pt alert to self, time, place and disoriented to situation.  Paged MD Elisabeth Pigeon and received verbal orders to DC 1:1 sitter.   Pt was educated about call light system and pt verbalizes understanding. Bed alarm activated.

## 2016-12-14 NOTE — Progress Notes (Signed)
[facility] [place] [facility]   PATIENT NAME: Carla Wilson    MR#:  [id number]  DATE OF BIRTH:  1950/02/04  SUBJECTIVE:  CHIEF COMPLAINT:   Chief Complaint  Patient presents with  . Dizziness  . Hallucinations   Brought by family due to hallucinations. She has chronic psychiatric history and her compliance to her medications is questionable. As per her sisters were present in the room, she lives with her grandson who is also" a drug addict" as per them. And they are not sure if patient is taking her medicine on her own over the giving it to them. She is noted to have some electrolyte imbalance and some signs of sinusitis so admitted to medical services. During my visit patient is completely confused and keeps talking while looking at the wall to some imaginary people and was also seeing some feathers flying in the room. As per her sisters she was recently given and filled up oxycodone 112 tablets, 10-15 days ago and there are very few tablets left in her bottle so they were suspecting she may have overdosed on that. But her urine drug screen is negative for cough pured so she may not have taken any.    Today more alert and making some sense in her talking. Had good sleep last night.  REVIEW OF SYSTEMS:     Review of Systems  Constitutional: Negative for fever and malaise/fatigue.  HENT: Negative for congestion, ear discharge and hearing loss.   Eyes: Negative for blurred vision, double vision and discharge.  Respiratory: Negative for cough, shortness of breath and wheezing.   Cardiovascular: Negative for chest pain, orthopnea and leg swelling.  Gastrointestinal: Negative for abdominal pain, diarrhea, nausea and vomiting.  Genitourinary: Negative for dysuria and frequency.  Musculoskeletal: Negative for joint pain and myalgias.  Skin: Negative for itching and rash.  Neurological: Negative for tremors, sensory change, focal weakness and seizures.    DRUG  ALLERGIES:   Allergies  Allergen Reactions  . Aspirin Other (See Comments)    Upset stomach  . Meperidine Hcl Nausea And Vomiting  . Penicillins Swelling    Has patient had a PCN reaction causing immediate rash, facial/tongue/throat swelling, SOB or lightheadedness with hypotension: Yes Has patient had a PCN reaction causing severe rash involving mucus membranes or skin necrosis: No Has patient had a PCN reaction that required hospitalization: No Has patient had a PCN reaction occurring within the last 10 years: No If all of the above answers are "NO", then may proceed with Cephalosporin use.  . Sulfa Antibiotics Nausea Only    VITALS:  Blood pressure 96/74, pulse (!) 105, temperature 98.6 F (37 C), temperature source Oral, resp. rate 14, height 5\' 5"  (1.651 m), weight 50.2 kg (110 lb 11.2 oz), SpO2 92 %.  PHYSICAL EXAMINATION:   GENERAL:  66 y.o.-year-old patient lying in the bed with no acute distress.  Frail appearing, nontoxic appearing EYES: Pupils equal, round, reactive to light and accommodation. No scleral icterus. Extraocular muscles intact.  HEENT: Head atraumatic, normocephalic. Oropharynx and nasopharynx clear.  Dry mucous membranes NECK:  Supple, no jugular venous distention. No thyroid enlargement, no tenderness.  Poor skin turgor LUNGS: Normal breath sounds bilaterally, no wheezing, rales,rhonchi or crepitation. No use of accessory muscles of respiration.  CARDIOVASCULAR: S1, S2 normal. No murmurs, rubs, or gallops.  ABDOMEN: Soft, nontender, nondistended. Bowel sounds present. No organomegaly or mass.  EXTREMITIES: No pedal edema, cyanosis, or clubbing. Severe deformities on both hands  due to RA. NEUROLOGIC: Cranial nerves II through XII are intact. Muscle strength 4/5 in all extremities. Gait not checked.  PSYCHIATRIC: The patient is alert and oriented x 2, SKIN: No obvious rash, lesion, or ulcer.    Physical Exam LABORATORY PANEL:   CBC Recent Labs  Lab  12/12/16 0505  WBC 6.2  HGB 10.2*  HCT 30.6*  PLT 412   ------------------------------------------------------------------------------------------------------------------  Chemistries  Recent Labs  Lab 12/13/16 0457 12/14/16 0329  NA 133*  --   K 3.7  --   CL 102  --   CO2 23  --   GLUCOSE 86  --   BUN <5*  --   CREATININE 0.42*  --   CALCIUM 8.2*  --   MG 1.5* 1.9   ------------------------------------------------------------------------------------------------------------------  Cardiac Enzymes Recent Labs  Lab 12/11/16 1959  TROPONINI <0.03   ------------------------------------------------------------------------------------------------------------------  RADIOLOGY:  No results found.  ASSESSMENT AND PLAN:   Principal Problem:   Acute delirium Active Problems:   Chronic prescription opiate use   Long term prescription benzodiazepine use  1 acute psychosis- benzodiazepine withdrawal and polypharmacy. Exact etiology is unknown Suspect due to multifactorial process which includes polypharmacy as patient is on numerous psychotropic meds (Xanax, Celexa, opioid medications, Cymbalta, steroids, tramadol, Ambien) and acute probable sinusitis Her compliance to her medication is questionable, as per her sister's- the other members that family might be using her medications as her urine is negative for all. Otherwise she has almost empty bottle of recently filled oxycodone. neurochecks per routine, hold psychotropic meds, treatment of acute sinusitis with Z-Pak, IV fluids for rehydration, fall/aspiration precautions, check ammonia level, check RPR, check MRI of the brain for further evaluation, consult psychiatry for expert opinion As she was on Xanax, I would like to add small dose of Xanax to avoid withdrawal symptoms.   Appreciated Psych eval- suspect benzo withdrawal.   Started on small dose xanax.  2 acute probable sinusitis Z-Pak CT head noted MRI brain  negative.  3 acute hyperthyroidism, unspecified TSH 0.183, T4 1.25 Check free T3 level, thyroid ultrasound for further investigation  Have some thyroid nodules, need further eval on that, get IR consult.  4 acute dehydration- Hypokalemia, Hypomagnesemia IV fluids for rehydration, replacement.   Improved.  5 acute hyponatremia/hypokalemia/hypochloremia Replete with IV fluids and check BMP in the morning.  6 chronic rheumatoid arthritis With associated chronic pain syndrome Continue oxycodone for now Hold steroids as these can also cause encephalopathy On family request, call hematology consult has they are concerned for flareup of RA.  As per rheumatologist- no steroids while psychosis.  7. Depression    Started on celexa    Psych on case.\  All the records are reviewed and case discussed with Care Management/Social Workerr. Management plans discussed with the patient, family and they are in agreement.  CODE STATUS: Full.  TOTAL TIME TAKING CARE OF THIS PATIENT: 35 minutes.  Need PT eval for d.c plan.  POSSIBLE D/C IN 1-2 DAYS, DEPENDING ON CLINICAL CONDITION.   Altamese Dilling M.D on 12/14/2016   Between 7am to 6pm - Pager - [phone]  After 6pm go to [email or web address] - password [email or web address]  [facility]  Office  [phone]  CC: Primary care physician; System, Pcp Not In  Note: This dictation was prepared with Dragon dictation along with smaller phrase technology. Any transcriptional errors that result from this process are unintentional.

## 2016-12-15 LAB — MAGNESIUM: MAGNESIUM: 1.7 mg/dL (ref 1.7–2.4)

## 2016-12-15 LAB — BASIC METABOLIC PANEL
Anion gap: 7 (ref 5–15)
BUN: 10 mg/dL (ref 6–20)
CALCIUM: 8 mg/dL — AB (ref 8.9–10.3)
CO2: 25 mmol/L (ref 22–32)
CREATININE: 0.59 mg/dL (ref 0.44–1.00)
Chloride: 98 mmol/L — ABNORMAL LOW (ref 101–111)
Glucose, Bld: 99 mg/dL (ref 65–99)
Potassium: 4.3 mmol/L (ref 3.5–5.1)
SODIUM: 130 mmol/L — AB (ref 135–145)

## 2016-12-15 LAB — PHOSPHORUS: PHOSPHORUS: 4.3 mg/dL (ref 2.5–4.6)

## 2016-12-15 MED ORDER — ALPRAZOLAM 0.5 MG PO TABS: 0.5000 mg | ORAL_TABLET | Freq: Two times a day (BID) | ORAL | 0 refills | Status: AC

## 2016-12-15 MED ORDER — RISPERIDONE 0.5 MG PO TBDP: 0.5000 mg | ORAL_TABLET | Freq: Every day | ORAL | 0 refills | Status: AC

## 2016-12-15 MED ORDER — ADULT MULTIVITAMIN W/MINERALS CH: 1.0000 | ORAL_TABLET | Freq: Every day | ORAL | 0 refills | Status: AC

## 2016-12-15 MED ORDER — AMLODIPINE BESYLATE 10 MG PO TABS: 10.0000 mg | ORAL_TABLET | Freq: Every day | ORAL | 0 refills | Status: AC

## 2016-12-15 MED ORDER — ENSURE ENLIVE PO LIQD: 237.0000 mL | Freq: Three times a day (TID) | ORAL | 12 refills | Status: AC

## 2016-12-15 MED ORDER — POLYETHYLENE GLYCOL 3350 17 G PO PACK: 17.0000 g | PACK | Freq: Every day | ORAL | 0 refills | Status: AC | PRN

## 2016-12-15 MED ORDER — OXYCODONE HCL 5 MG PO TABS: 10.0000 mg | ORAL_TABLET | Freq: Four times a day (QID) | ORAL | 0 refills | Status: AC | PRN

## 2016-12-15 NOTE — [place] FL2 (Signed)
[place] MEDICAID FL2 LEVEL OF CARE SCREENING TOOL     IDENTIFICATION  Patient Name: Carla Wilson Birthdate: [birth year] Sex: female Admission Date (Current Location): 12/11/2016  [place] and [place] Number:  Chiropodist and Address:  [facility], [address] [place], [place] [address]      Provider Number: [id number]  Attending Physician Name and Address:  Altamese Dilling, *  Relative Name and Phone Number:       Current Level of Care: Hospital Recommended Level of Care: Skilled Nursing Facility Prior Approval Number:    Date Approved/Denied:   PASRR Number: [id number])  Discharge Plan: SNF    Current Diagnoses: Patient Active Problem List   Diagnosis Date Noted  . Acute psychosis (HCC) 12/12/2016  . Acute delirium 12/12/2016  . Chronic prescription opiate use 12/12/2016  . Long term prescription benzodiazepine use 12/12/2016  . Depression 11/03/2016  . CAP (community acquired pneumonia) 11/03/2016  . NSTEMI (non-ST elevated myocardial infarction) (HCC) 11/03/2016  . Multiple contusions 04/13/2011  . URI (upper respiratory infection) 03/13/2011  . LEG PAIN, RIGHT 11/03/2009  . NONTRAUMATIC RUPTURE OF UNSPECIFIED TENDON 09/21/2009  . PULMONARY DISEASE 06/17/2009  . HYPOTHYROIDISM 06/01/2009  . Anxiety 06/01/2009  . PANIC ATTACK 06/01/2009  . MIGRAINE HEADACHE 06/01/2009  . Rheumatoid arthritis (HCC) 06/01/2009  . FIBROMYALGIA 06/01/2009  . INSOMNIA 06/01/2009  . CHRONIC FATIGUE SYNDROME 06/01/2009    Orientation RESPIRATION BLADDER Height & Weight     Self, Time, Situation, Place  Normal Continent Weight: 110 lb 11.2 oz (50.2 kg)(bed scale) Height:  5\' 5"  (165.1 cm)  BEHAVIORAL SYMPTOMS/MOOD NEUROLOGICAL BOWEL NUTRITION STATUS      Continent Diet(Diet: DYS 3 )  AMBULATORY STATUS COMMUNICATION OF NEEDS Skin   Extensive Assist Verbally Normal                       Personal Care Assistance Level of  Assistance  Bathing, Feeding, Dressing Bathing Assistance: Limited assistance Feeding assistance: Independent Dressing Assistance: Limited assistance     Functional Limitations Info  Sight, Hearing, Speech Sight Info: Adequate Hearing Info: Adequate Speech Info: Adequate    SPECIAL CARE FACTORS FREQUENCY  PT (By licensed PT), OT (By licensed OT)     PT Frequency: (5) OT Frequency: (5)            Contractures      Additional Factors Info  Code Status, Allergies Code Status Info: (Full Code. ) Allergies Info: (Aspirin, Meperidine Hcl, Penicillins, Sulfa Antibiotics)           Current Medications (12/15/2016):  This is the current hospital active medication list Current Facility-Administered Medications  Medication Dose Route Frequency Provider Last Rate Last Dose  . acetaminophen (TYLENOL) tablet 650 mg  650 mg Oral Q6H PRN Salary, Montell D, MD       Or  . acetaminophen (TYLENOL) suppository 650 mg  650 mg Rectal Q6H PRN Salary, Montell D, MD      . albuterol (PROVENTIL) (2.5 MG/3ML) 0.083% nebulizer solution 3 mL  3 mL Inhalation Q6H PRN Salary, Montell D, MD      . ALPRAZolam 12/17/2016) tablet 0.5 mg  0.5 mg Oral BID Prudy Feeler, MD   0.5 mg at 12/15/16 0919  . amLODipine (NORVASC) tablet 10 mg  10 mg Oral Daily 12/17/16, MD   Stopped at 12/15/16 [phone]  . citalopram (CELEXA) tablet 40 mg  40 mg Oral Daily [id number], MD   40 mg  at 12/15/16 0919  . feeding supplement (ENSURE ENLIVE) (ENSURE ENLIVE) liquid 237 mL  237 mL Oral TID BM Altamese Dilling, MD   237 mL at 12/15/16 1040  . multivitamin with minerals tablet 1 tablet  1 tablet Oral Daily Salary, Montell D, MD   1 tablet at 12/15/16 0919  . ondansetron (ZOFRAN) tablet 4 mg  4 mg Oral Q6H PRN Salary, Montell D, MD       Or  . ondansetron (ZOFRAN) injection 4 mg  4 mg Intravenous Q6H PRN Salary, Montell D, MD      . oxyCODONE (Oxy IR/ROXICODONE) immediate release tablet 15 mg   15 mg Oral Q6H Altamese Dilling, MD   15 mg at 12/15/16 0542  . polyethylene glycol (MIRALAX / GLYCOLAX) packet 17 g  17 g Oral Daily PRN Salary, Montell D, MD      . risperiDONE (RISPERDAL M-TABS) disintegrating tablet 0.5 mg  0.5 mg Oral QHS Clapacs, John T, MD   0.5 mg at 12/14/16 2138  . senna-docusate (Senokot-S) tablet 1 tablet  1 tablet Oral BID Angelina Ok D, MD   1 tablet at 12/15/16 0919  . sodium chloride flush (NS) 0.9 % injection 3 mL  3 mL Intravenous Q12H Salary, Montell D, MD   3 mL at 12/15/16 1040     Discharge Medications: Please see discharge summary for a list of discharge medications.  Relevant Imaging Results:  Relevant Lab Results:   Additional Information (SSN: [id number])  Keshanna Riso, Darleen Crocker, LCSW

## 2016-12-15 NOTE — Progress Notes (Addendum)
PT is recommending SNF. Clinical Social Worker (CSW) met with patient and her sister Coralyn Mark at bedside to discuss D/C plan. Patient and sister are agreeable to SNF search and prefer [facility] in [place]. FL2 complete and faxed out.   Per Coralyn Mark patient can bring her enbrel injections from home she has 4 injections for 1 month. MD has put a hold on enbrel for now and patient will follow up with PCP. Sister Coralyn Mark is aware of above.   CSW presented bed offers to patient and she chose [facility]. Patient is medically stable for D/C to [facility] today. Per [facility] admissions coordinator at [facility] patient can come today to private room 210. RN will call report and arrange EMS for transport. CSW sent D/C orders to [facility] via HUB. Patient is aware of above. Patient's sister Coralyn Mark is aware of above. [facility] [employer] (APS) worker Tia came to visit patient today and is aware of above. CSW provided requested clinicals to [place]. Please reconsult if future social work needs arise. CSW signing off.   [employer], LCSW [phone]

## 2016-12-15 NOTE — Progress Notes (Signed)
   12/15/16 1025  Clinical Encounter Type  Visited With Patient and family together  Referral From Nurse  Consult/Referral To Chaplain  Spiritual Encounters  Spiritual Needs Emotional  Stress Factors  Patient Stress Factors Health changes  Family Stress Factors Health changes  Advance Directives (For Healthcare)  Does Patient Have a Medical Advance Directive? No   Chaplain responded to page to provide information regarding advanced directives.  Visited with pt and her sister.  Pt shared that her daughter died a year ago and pts sister shared that their father passed away and they appreciated knowing what to do from his advanced directives.  Pts sister was tearful.  Chaplain provided emotional and spiritual support to pt and sister.  Chaplain provided information on advanced directives and contacted notary.    Rev. Coralie Stanke Zenaida Niece [employer]

## 2016-12-15 NOTE — Discharge Summary (Signed)
[facility] - [place] at [facility]   PATIENT NAME: Carla Wilson    MR#:  [id number]  DATE OF BIRTH:  07-21-1950  DATE OF ADMISSION:  12/11/2016 ADMITTING PHYSICIAN: Bertrum Sol, MD  DATE OF DISCHARGE: 12/15/2016   PRIMARY CARE PHYSICIAN: System, Pcp Not In    ADMISSION DIAGNOSIS:  Delirium [R41.0] Visual hallucinations [R44.1] Encephalopathy acute [G93.40]  DISCHARGE DIAGNOSIS:  Principal Problem:   Acute delirium Active Problems:   Chronic prescription opiate use   Long term prescription benzodiazepine use   SECONDARY DIAGNOSIS:   Past Medical History:  Diagnosis Date  . Anxiety   . Collagen vascular disease (HCC)   . Depression   . Fibromyalgia   . Pulmonary disease    NOS  . RA (rheumatoid arthritis) (HCC)   . Rheumatoid arthritis [facility])     HOSPITAL COURSE:   1acute psychosis- benzodiazepine withdrawal and polypharmacy.  Suspect due to multifactorial process which includes polypharmacy as patient is on numerous psychotropic meds(Xanax, Celexa, opioid medications, Cymbalta, steroids, tramadol, Ambien)and acute probable sinusitis Her compliance to her medication is questionable, as per her sister's- the other members that family might be using her medications as her urine is negative for all. Otherwise she has almost empty bottle of recently filled oxycodone. neurochecks per routine, hold psychotropic meds, treatment of acute sinusitis with Z-Pak, IV fluids for rehydration, fall/aspiration precautions, check ammonia level, check RPR, check MRI of the brain for further evaluation, consult psychiatry for expert opinion As she was on Xanax,  add small dose of Xanax to avoid withdrawal symptoms.   Appreciated Psych eval- suspect benzo withdrawal.   Improved much, stable- advise to follow with her PMD or psych physician in 2-3 weeks to re-adjust meds.  2acute probable sinusitis Z-Pak CT head noted MRI brain negative.   Given  Abx for 4 days, no more need.  3acute hyperthyroidism, unspecified TSH 0.183, T4 1.25 Check free T3 level, thyroid ultrasound for further investigation  Have some thyroid nodules, need further eval on that, as out pt with PMD.  4acute dehydration- Hypokalemia, Hypomagnesemia IV fluids for rehydration, replacement.   Improved.  5acute hyponatremia/hypokalemia/hypochloremia Replete with IV fluids and check BMP in the morning.  6chronic rheumatoid arthritis With associated chronic pain syndrome Continue oxycodone for now Hold steroids as these can also cause encephalopathy On family request, call hematology consult has they are concerned for flareup of RA.  As per rheumatologist- no steroids while psychosis.  follow with rheumatology clinic in 2 weeks.   7. Depression    Started on celexa    Psych on case.   DISCHARGE CONDITIONS:   Stable.  CONSULTS OBTAINED:  Treatment Team:  Audery Amel, MD Kandyce Rud., MD  DRUG ALLERGIES:   Allergies  Allergen Reactions  . Aspirin Other (See Comments)    Upset stomach  . Meperidine Hcl Nausea And Vomiting  . Penicillins Swelling    Has patient had a PCN reaction causing immediate rash, facial/tongue/throat swelling, SOB or lightheadedness with hypotension: Yes Has patient had a PCN reaction causing severe rash involving mucus membranes or skin necrosis: No Has patient had a PCN reaction that required hospitalization: No Has patient had a PCN reaction occurring within the last 10 years: No If all of the above answers are "NO", then may proceed with Cephalosporin use.  . Sulfa Antibiotics Nausea Only    DISCHARGE MEDICATIONS:   Current Discharge Medication List    START taking these medications  Details  amLODipine (NORVASC) 10 MG tablet Take 1 tablet (10 mg total) daily by mouth. Qty: 30 tablet, Refills: 0    feeding supplement, ENSURE ENLIVE, (ENSURE ENLIVE) LIQD Take 237 mLs 3 (three) times daily  between meals by mouth. Qty: 237 mL, Refills: 12    Multiple Vitamin (MULTIVITAMIN WITH MINERALS) TABS tablet Take 1 tablet daily by mouth. Qty: 30 tablet, Refills: 0    polyethylene glycol (MIRALAX / GLYCOLAX) packet Take 17 g daily as needed by mouth for mild constipation. Qty: 14 each, Refills: 0    risperiDONE (RISPERDAL M-TABS) 0.5 MG disintegrating tablet Take 1 tablet (0.5 mg total) at bedtime by mouth. Qty: 30 tablet, Refills: 0      CONTINUE these medications which have CHANGED   Details  ALPRAZolam (XANAX) 0.5 MG tablet Take 1 tablet (0.5 mg total) 2 (two) times daily by mouth. Qty: 20 tablet, Refills: 0    oxyCODONE (ROXICODONE) 5 MG immediate release tablet Take 2 tablets (10 mg total) every 6 (six) hours as needed by mouth for moderate pain or severe pain. Qty: 20 tablet, Refills: 0      CONTINUE these medications which have NOT CHANGED   Details  albuterol (PROVENTIL HFA;VENTOLIN HFA) 108 (90 Base) MCG/ACT inhaler Inhale 1-2 puffs into the lungs every 6 (six) hours as needed for wheezing or shortness of breath.    citalopram (CELEXA) 40 MG tablet Take 40 mg by mouth daily.    senna-docusate (SENOKOT-S) 8.6-50 MG tablet Take 1 tablet by mouth 2 (two) times daily. Qty: 30 tablet, Refills: 1      STOP taking these medications     estradiol (CLIMARA - DOSED IN MG/24 HR) 0.05 mg/24hr patch      etanercept (ENBREL) 25 MG injection      benzonatate (TESSALON) 200 MG capsule      doxycycline (VIBRAMYCIN) 100 MG capsule      DULoxetine (CYMBALTA) 60 MG capsule      predniSONE (STERAPRED UNI-PAK 21 TAB) 10 MG (21) TBPK tablet      traMADol (ULTRAM) 50 MG tablet      zolpidem (AMBIEN) 5 MG tablet          DISCHARGE INSTRUCTIONS:    Follow with Psych and rheumatology clinic in 2-3 weeks.  If you experience worsening of your admission symptoms, develop shortness of breath, life threatening emergency, suicidal or homicidal thoughts you must seek medical  attention immediately by calling 911 or calling your MD immediately  if symptoms less severe.  You Must read complete instructions/literature along with all the possible adverse reactions/side effects for all the Medicines you take and that have been prescribed to you. Take any new Medicines after you have completely understood and accept all the possible adverse reactions/side effects.   Please note  You were cared for by a hospitalist during your hospital stay. If you have any questions about your discharge medications or the care you received while you were in the hospital after you are discharged, you can call the unit and asked to speak with the hospitalist on call if the hospitalist that took care of you is not available. Once you are discharged, your primary care physician will handle any further medical issues. Please note that NO REFILLS for any discharge medications will be authorized once you are discharged, as it is imperative that you return to your primary care physician (or establish a relationship with a primary care physician if you do not have one) for your aftercare  needs so that they can reassess your need for medications and monitor your lab values.    Today   CHIEF COMPLAINT:   Chief Complaint  Patient presents with  . Dizziness  . Hallucinations    HISTORY OF PRESENT ILLNESS:  Carla Wilson  is a 66 y.o. female presenting with visual and auditory hallucinations for the last 3 days, dizziness, confusion per ED attending and discussion with the patient's family members, ER workup noted for sodium 132, potassium 3.4, chloride 94, anion gap 16, TSH low, T4 slightly elevated, urinalysis positive for ketones, urine drug screen positive for benzodiazepines/try cyclic's, CT head noted for right maxillary as well as sphenoid sinusitis, EKG with sinus tachycardia heart rate 104, patient evaluated in the emergency room, patient is a poor historian, patient thinks that she is in a  tree, no family available at this time to give further information, patient appears anxious, patient denies pain.  Patient is now being admitted for acute psychosis, acute dehydration, acute sinusitis, and hyperthyroidism.    VITAL SIGNS:  Blood pressure (!) 101/59, pulse (!) 102, temperature 98.5 F (36.9 C), temperature source Oral, resp. rate 20, height 5\' 5"  (1.651 m), weight 50.2 kg (110 lb 11.2 oz), SpO2 92 %.  I/O:    Intake/Output Summary (Last 24 hours) at 12/15/2016 1230 Last data filed at 12/15/2016 0900 Gross per 24 hour  Intake 483 ml  Output 900 ml  Net -417 ml    PHYSICAL EXAMINATION:   GENERAL:66 y.o.-year-old patient lying in the bed with no acute distress.Frail appearing, nontoxic appearing EYES: Pupils equal, round, reactive to light and accommodation. No scleral icterus. Extraocular muscles intact.  HEENT: Head atraumatic, normocephalic. Oropharynx and nasopharynx clear.Dry mucous membranes NECK: Supple, no jugular venous distention. No thyroid enlargement, no tenderness. Poor skin turgor LUNGS: Normal breath sounds bilaterally, no wheezing, rales,rhonchi or crepitation. No use of accessory muscles of respiration.  CARDIOVASCULAR: S1, S2 normal. No murmurs, rubs, or gallops.  ABDOMEN: Soft, nontender, nondistended. Bowel sounds present. No organomegaly or mass.  EXTREMITIES: No pedal edema, cyanosis, or clubbing. Severe deformities on both hands due to RA. NEUROLOGIC: Cranial nerves II through XII are intact. Muscle strength 4/5 in all extremities. Gait not checked.  PSYCHIATRIC: The patient is alert and oriented x2, SKIN: No obvious rash, lesion, or ulcer.      DATA REVIEW:   CBC Recent Labs  Lab 12/12/16 0505  WBC 6.2  HGB 10.2*  HCT 30.6*  PLT 412    Chemistries  Recent Labs  Lab 12/15/16 0318  NA 130*  K 4.3  CL 98*  CO2 25  GLUCOSE 99  BUN 10  CREATININE 0.59  CALCIUM 8.0*  MG 1.7    Cardiac Enzymes Recent Labs  Lab  12/11/16 1959  TROPONINI <0.03    Microbiology Results  Results for orders placed or performed during the hospital encounter of 11/03/16  Blood Culture (routine x 2)     Status: None   Collection Time: 11/03/16 11:02 PM  Result Value Ref Range Status   Specimen Description BLOOD RIGHT ANTECUBITAL  Final   Special Requests   Final    BOTTLES DRAWN AEROBIC AND ANAEROBIC Blood Culture adequate volume   Culture NO GROWTH 5 DAYS  Final   Report Status 11/08/2016 FINAL  Final  Blood Culture (routine x 2)     Status: None   Collection Time: 11/03/16 11:02 PM  Result Value Ref Range Status   Specimen Description BLOOD RIGHT FOREARM  Final  Special Requests   Final    BOTTLES DRAWN AEROBIC AND ANAEROBIC Blood Culture adequate volume   Culture NO GROWTH 5 DAYS  Final   Report Status 11/08/2016 FINAL  Final    RADIOLOGY:  No results found.  EKG:   Orders placed or performed during the hospital encounter of 12/11/16  . ED EKG  . ED EKG  . EKG      Management plans discussed with the patient, family and they are in agreement.  CODE STATUS:     Code Status Orders  (From admission, onward)        Start     Ordered   12/12/16 0423  Full code  Continuous     12/12/16 0422    Code Status History    Date Active Date Inactive Code Status Order ID Comments User Context   11/04/2016 00:34 11/05/2016 19:18 Full Code [id number]  Oralia Manis, MD Inpatient   09/18/2011 23:50 09/19/2011 16:33 Full Code [id number]  Raeford Razor, MD ED      TOTAL TIME TAKING CARE OF THIS PATIENT: 35 minutes.    Altamese Dilling M.D on 12/15/2016 at 12:30 PM  Between 7am to 6pm - Pager - [phone]  After 6pm go to [email or web address] - password [email or web address]  [facility]  Office  [phone]  CC: Primary care physician; System, Pcp Not In   Note: This dictation was prepared with Dragon dictation along with smaller phrase technology. Any transcriptional errors that result from  this process are unintentional.

## 2016-12-15 NOTE — Care Management Note (Signed)
Case Management Note  Patient Details  Name: Carla Wilson MRN: [id number] Date of Birth: 10-Oct-1950  Subjective/Objective:                  Met with patient and her sister [name]) [phone]. CSW working on SNF at [employer] pending PT recommendations. Patient is from home with her grandson that "cooks and patient reheats". Patient states that since a bout with pneumonia in October she has declined in her abilities with ADLs. She does not have any ambulation/DME in the home per patient. She does have a shower chair. She states [facility] PT has had one visit with her prior to this admission. Her PCP is in [place] Dr. Mancel Bale. She requests assistance with HCPOA assignment.    Action/Plan:  Chaplain services requested for HCPOA- they are visiting with patient during this note entry. APS is also her [place] visiting with patient. [facility] aware patient is her. If patient can return home at discharge please include PT and Education officer, museum.   Expected Discharge Date:                  Expected Discharge Plan:     In-House Referral:     Discharge planning Services  CM Consult  Post Acute Care Choice:  Home Health Choice offered to:  Patient, Sibling  DME Arranged:    DME Agency:     HH Arranged:  PT, Social Work [employer] Agency:  [place]  Status of Service:  In process, will continue to follow  If discussed at Long Length of Stay Meetings, dates discussed:    Additional Comments:  Marshell Garfinkel, RN 12/15/2016, 11:07 AM

## 2016-12-15 NOTE — Progress Notes (Signed)
   12/15/16 1110  Clinical Encounter Type  Visited With Patient and family together  Referral From Chaplain  Consult/Referral To Chaplain  Advance Directives (For Healthcare)  Does Patient Have a Medical Advance Directive? Yes   Chaplain contacted Notary and found witnesses to aid pt in completing advanced directive.  Chaplain had unit clerk put advanced directive in pts electronic medical reccord.    Rev. Abdalla Naramore Zenaida Niece [employer]

## 2016-12-15 NOTE — Discharge Instructions (Signed)
Follow with Psychiatry clinic in 2 weeks.

## 2016-12-15 NOTE — Care Management Important Message (Signed)
Important Message  Patient Details  Name: Carla Wilson MRN: [id number] Date of Birth: [birth year]   Medicare Important Message Given:  Yes    Collie Siad, RN 12/15/2016, 8:53 AM

## 2016-12-15 NOTE — Care Management (Addendum)
[facility] aware that patient anticipates going to [facility].

## 2016-12-15 NOTE — Progress Notes (Signed)
Chaplain rounding unit visited pt. Pt was calm and reflective at the time of this visit. Pt told CH that she is feeling the loss of her husband and her father more now than she did before. Pt reports about her health struggles, " I have been on this hard road before and some days are better than others", she said. Pt appeared lonely but mentioned that her sisters and niece have been there for her. Pt asked CH to lift her up in prayers, which CH agreed. CH to make follow up with this pt as needed.    12/15/16 1600  Clinical Encounter Type  Visited With Patient  Visit Type Initial;Follow-up  Referral From Chaplain  Consult/Referral To Chaplain  Spiritual Encounters  Spiritual Needs Emotional;Other (Comment)

## 2016-12-15 NOTE — Progress Notes (Signed)
Report called to Moss Mc, Pam RN.  911 called for transport.

## 2016-12-15 NOTE — Clinical Social Work Placement (Signed)
   CLINICAL SOCIAL WORK PLACEMENT  NOTE  Date:  12/15/2016  Patient Details  Name: Carla Wilson MRN: [id number] Date of Birth: 01/04/1951  Clinical Social Work is seeking post-discharge placement for this patient at the Skilled  Nursing Facility level of care (*CSW will initial, date and re-position this form in  chart as items are completed):  Yes   Patient/family provided with [place] Clinical Social Work Department's list of facilities offering this level of care within the geographic area requested by the patient (or if unable, by the patient's family).  Yes   Patient/family informed of their freedom to choose among providers that offer the needed level of care, that participate in Medicare, Medicaid or managed care program needed by the patient, have an available bed and are willing to accept the patient.  Yes   Patient/family informed of 's ownership interest in [facility] and [facility], as well as of the fact that they are under no obligation to receive care at these facilities.  PASRR submitted to EDS on 12/15/16     PASRR number received on 12/15/16     Existing PASRR number confirmed on       FL2 transmitted to all facilities in geographic area requested by pt/family on 12/15/16     FL2 transmitted to all facilities within larger geographic area on       Patient informed that his/her managed care company has contracts with or will negotiate with certain facilities, including the following:        Yes   Patient/family informed of bed offers received.  Patient chooses bed at ([facility] )     Physician recommends and patient chooses bed at      Patient to be transferred to ([facility] ) on 12/15/16.  Patient to be transferred to facility by [facility] )     Patient family notified on 12/15/16 of transfer.  Name of family member notified:  (Patinet's sister Aurther Loft is at bedside and aware of D/C  today. )     PHYSICIAN       Additional Comment:    _______________________________________________ Una Yeomans, Darleen Crocker, LCSW 12/15/2016, 1:47 PM

## 2016-12-15 NOTE — Plan of Care (Signed)
Discharge instructions, med changes, and follow up explained to patient and sister.  Report called to [facility]

## 2017-05-02 ENCOUNTER — Other Ambulatory Visit: Payer: Self-pay | Admitting: Physician Assistant

## 2017-05-02 DIAGNOSIS — M8580 Other specified disorders of bone density and structure, unspecified site: Secondary | ICD-10-CM

## 2018-03-31 DEATH — deceased

## 2018-06-07 IMAGING — US US THYROID
1 series · 12 of 25 positions shown · non-contrast
Comparison: 04/02/2006, 12/07/2006

CLINICAL DATA: Multinodular thyroid, previous right thyroid
dominant nodule biopsy 04/27/2006 hypothyroidism

EXAM:
THYROID ULTRASOUND
TECHNIQUE: Ultrasound examination of the thyroid gland and adjacent soft
tissues was performed.

[Series 1: us thyroid · 0.07mm/px · 12 of 95 slices shown]
[im 4/95]
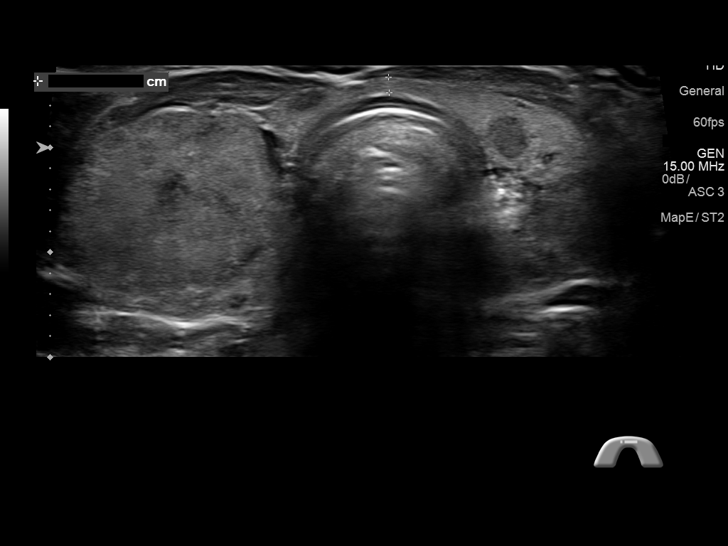
[im 12/95]
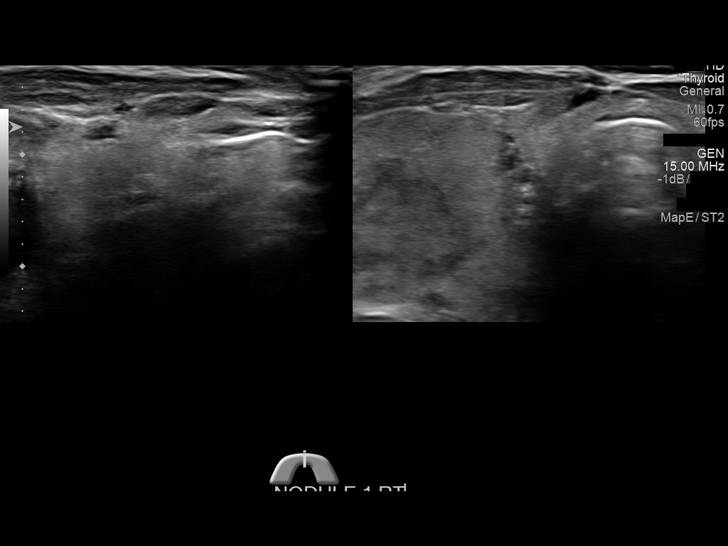
[im 20/95]
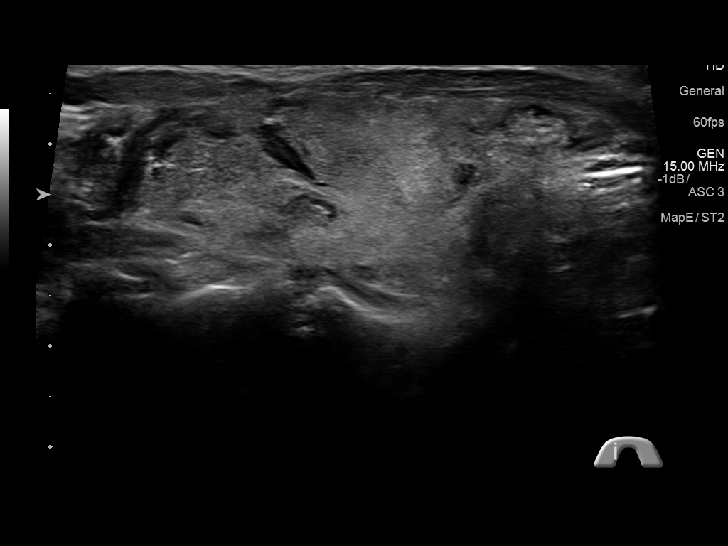
[im 28/95]
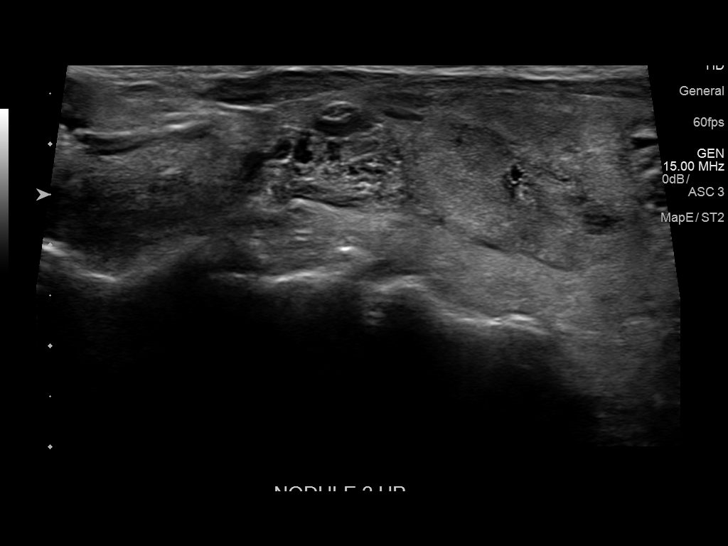
[im 36/95]
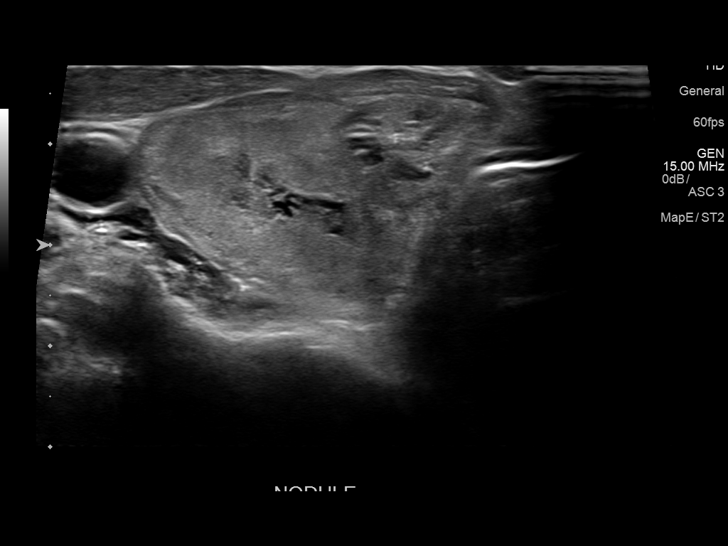
[im 44/95]
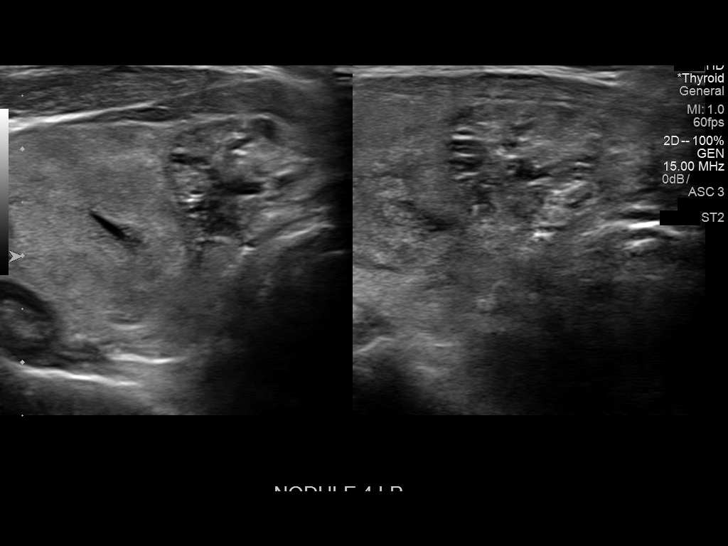
[im 51/95]
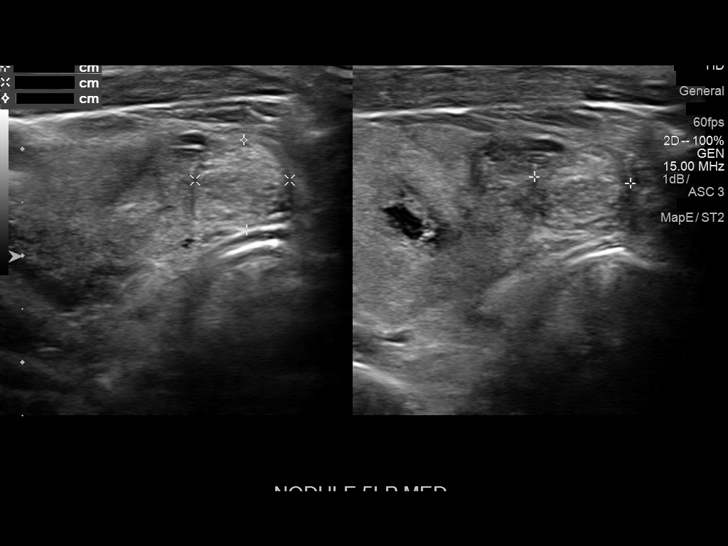
[im 59/95]
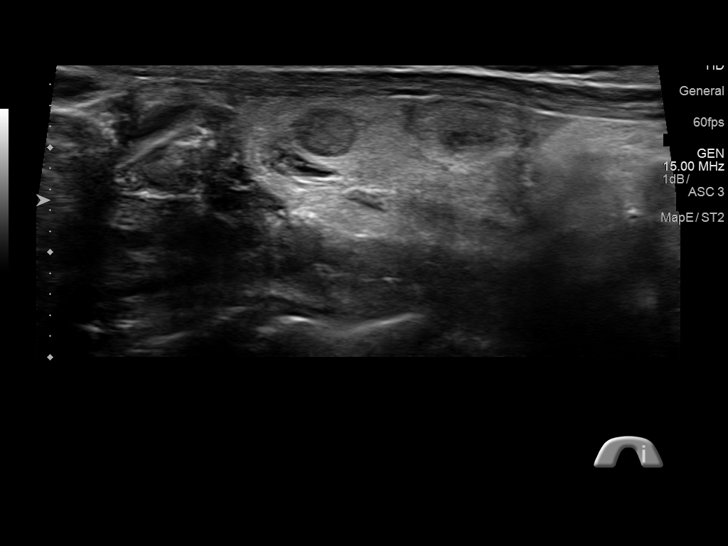
[im 67/95]
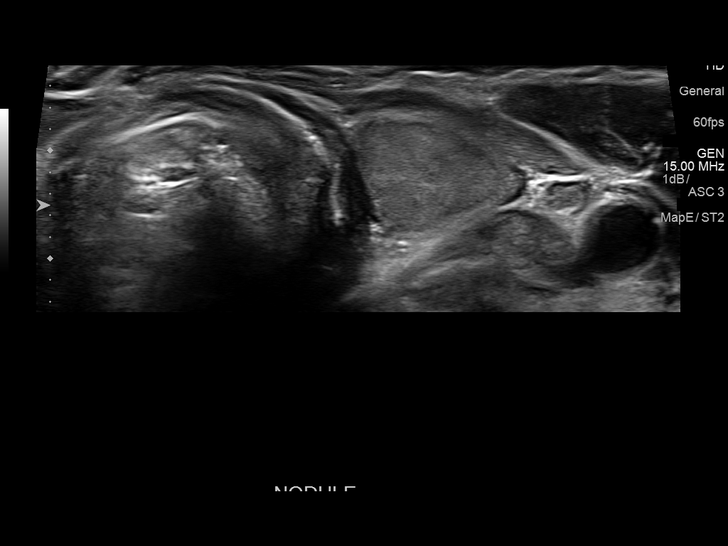
[im 75/95]
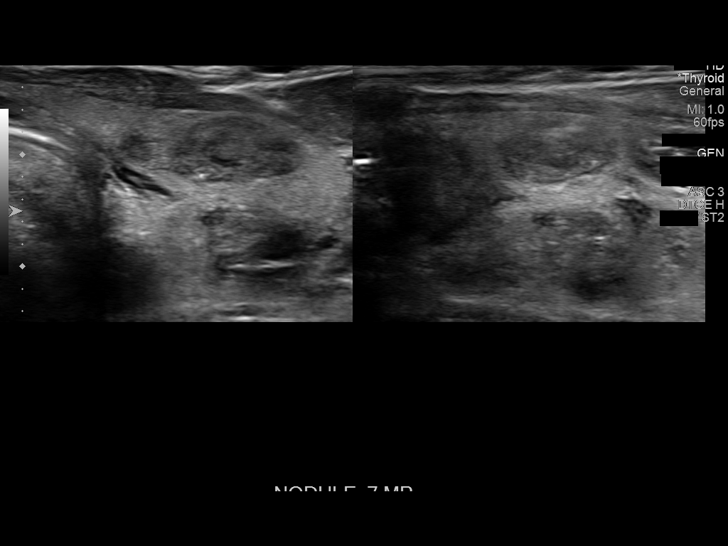
[im 83/95]
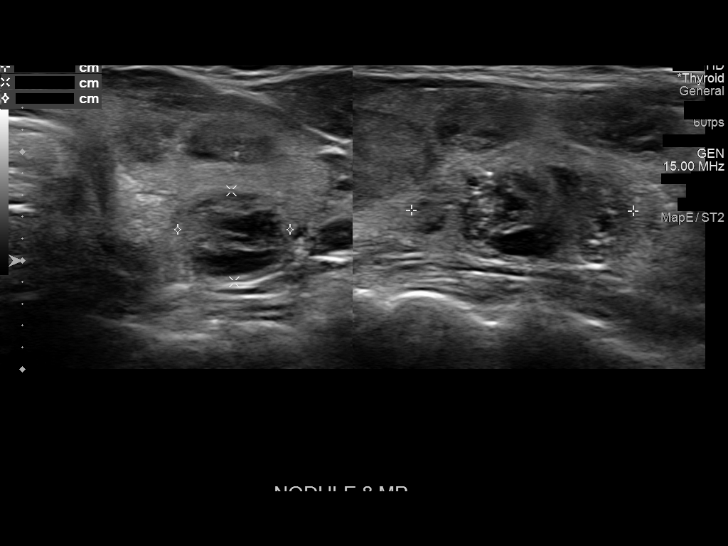
[im 91/95]
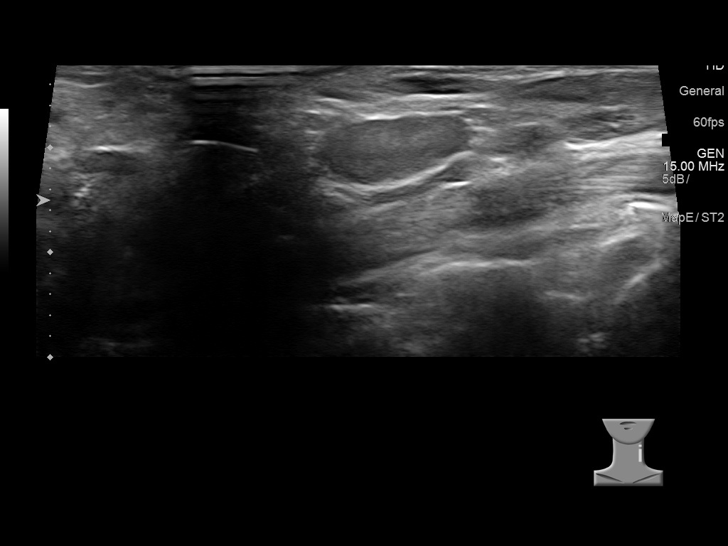

[12 of 25 positions shown; findings below may reference images not displayed]

FINDINGS: Parenchymal Echotexture: Moderately heterogenous

Isthmus: 2 mm

Right lobe: 5.7 x 1.8 x 2.7 cm, previously 4.8 x 1.8 x 2.4 cm

Left lobe: 4.1 x 1.5 x 1.8 cm, previously 4.7 x 1.6 x 2.2 cm

_________________________________________________________

Estimated total number of nodules >/= 1 cm: 6-10

Number of spongiform nodules >/=  2 cm not described below (TR1): 0

Number of mixed cystic and solid nodules >/= 1.5 cm not described
below (TR2): 3

_________________________________________________________

Nodule # 3:

Location: Right; Mid

Maximum size: 3.1, previously 2.5 cm; Other 2 dimensions: 2.1 x 1.7,
previously 1.9 x 1.2 cm

Composition: solid/almost completely solid (2)

Echogenicity: isoechoic (1)

Shape: not taller-than-wide (0)

Margins: ill-defined (0)

Echogenic foci: none (0)

ACR TI-RADS total points: 3.

ACR TI-RADS risk category: TR3 (3 points).

ACR TI-RADS recommendations:

**Given size (>/= 2.5 cm) and appearance, fine needle aspiration of
this mildly suspicious nodule should be considered based on TI-RADS
criteria.

_________________________________________________________

Nodule # 6:

Location: Left; Superior

Maximum size: 1.7, previously 1.0 cm; Other 2 dimensions: 1.4 x 1.0,
previously 0.9 x 0.7 cm

Composition: solid/almost completely solid (2)

Echogenicity: isoechoic (1)

Shape: not taller-than-wide (0)

Margins: smooth (0)

Echogenic foci: none (0)

ACR TI-RADS total points: 3.

ACR TI-RADS risk category: TR3 (3 points).

ACR TI-RADS recommendations:

*Given size (>/= 1.5 - 2.4 cm) and appearance, a follow-up
ultrasound in 1 year should be considered based on TI-RADS criteria.

_________________________________________________________

Nodule # 7:

Location: Left; Mid

Maximum size: 1.2, previously 0.7 cm; Other 2 dimensions: 1.0 x 0.6,
previously 0.7 x 0.4 cm

Composition: solid/almost completely solid (2)

Echogenicity: hypoechoic (2)

Shape: not taller-than-wide (0)

Margins: smooth (0)

Echogenic foci: none (0)

ACR TI-RADS total points: 4.

ACR TI-RADS risk category: TR4 (4-6 points).

ACR TI-RADS recommendations:

*Given size (>/= 1 - 1.4 cm) and appearance, a follow-up ultrasound
in 1 year should be considered based on TI-RADS criteria.

_________________________________________________________

There are additional bilateral mixed cystic and solid nodules as
well less subcentimeter hypoechoic nodules which are not fully
described by TI RADS criteria
IMPRESSION: 3.1 cm right mid thyroid TR 3 nodule meets criteria for biopsy as
above.

Additional left TR 3 and TR 4 nodules as above meet criteria for
follow-up in 1 year.

The above is in keeping with the ACR TI-RADS recommendations - [HOSPITAL] 7508;[DATE].
